# Patient Record
Sex: Female | Born: 1950
Health system: Southern US, Community
[De-identification: ages and names within clinical notes are randomized; demographics above are authoritative.]

## PROBLEM LIST (undated history)

## (undated) DIAGNOSIS — D219 Benign neoplasm of connective and other soft tissue, unspecified: Secondary | ICD-10-CM

## (undated) DIAGNOSIS — Z87442 Personal history of urinary calculi: Secondary | ICD-10-CM

## (undated) DIAGNOSIS — F419 Anxiety disorder, unspecified: Secondary | ICD-10-CM

## (undated) DIAGNOSIS — R7303 Prediabetes: Secondary | ICD-10-CM

## (undated) DIAGNOSIS — M1990 Unspecified osteoarthritis, unspecified site: Secondary | ICD-10-CM

## (undated) DIAGNOSIS — Z8719 Personal history of other diseases of the digestive system: Secondary | ICD-10-CM

## (undated) DIAGNOSIS — I10 Essential (primary) hypertension: Secondary | ICD-10-CM

## (undated) DIAGNOSIS — K219 Gastro-esophageal reflux disease without esophagitis: Secondary | ICD-10-CM

## (undated) DIAGNOSIS — N3281 Overactive bladder: Secondary | ICD-10-CM

## (undated) DIAGNOSIS — G709 Myoneural disorder, unspecified: Secondary | ICD-10-CM

## (undated) DIAGNOSIS — G4730 Sleep apnea, unspecified: Secondary | ICD-10-CM

## (undated) DIAGNOSIS — G510 Bell's palsy: Secondary | ICD-10-CM

## (undated) HISTORY — PX: LASIK: SHX215

## (undated) HISTORY — PX: APPENDECTOMY: SHX54

## (undated) HISTORY — PX: BREAST REDUCTION SURGERY: SHX8

## (undated) HISTORY — DX: Overactive bladder: N32.81

## (undated) HISTORY — DX: Sleep apnea, unspecified: G47.30

## (undated) HISTORY — DX: Anxiety disorder, unspecified: F41.9

## (undated) HISTORY — DX: Bell's palsy: G51.0

## (undated) HISTORY — PX: TUBAL LIGATION: SHX77

## (undated) HISTORY — PX: BREAST SURGERY: SHX581

## (undated) HISTORY — PX: ACHILLES TENDON SURGERY: SHX542

## (undated) HISTORY — DX: Benign neoplasm of connective and other soft tissue, unspecified: D21.9

---

## 1973-10-22 HISTORY — PX: CHOLECYSTECTOMY: SHX55

## 1986-10-22 HISTORY — PX: CARPAL TUNNEL RELEASE: SHX101

## 1993-10-22 HISTORY — PX: ABDOMINAL HYSTERECTOMY: SHX81

## 1998-04-19 ENCOUNTER — Other Ambulatory Visit: Admission: RE | Admit: 1998-04-19 | Discharge: 1998-04-19 | Payer: Self-pay | Admitting: Obstetrics and Gynecology

## 1998-05-09 ENCOUNTER — Ambulatory Visit (HOSPITAL_COMMUNITY): Admission: RE | Admit: 1998-05-09 | Discharge: 1998-05-09 | Payer: Self-pay | Admitting: Obstetrics and Gynecology

## 1999-05-11 ENCOUNTER — Ambulatory Visit (HOSPITAL_COMMUNITY): Admission: RE | Admit: 1999-05-11 | Discharge: 1999-05-11 | Payer: Self-pay | Admitting: Obstetrics and Gynecology

## 1999-05-17 ENCOUNTER — Encounter: Payer: Self-pay | Admitting: Obstetrics and Gynecology

## 1999-05-17 ENCOUNTER — Ambulatory Visit (HOSPITAL_COMMUNITY): Admission: RE | Admit: 1999-05-17 | Discharge: 1999-05-17 | Payer: Self-pay | Admitting: Obstetrics and Gynecology

## 1999-05-24 ENCOUNTER — Ambulatory Visit (HOSPITAL_COMMUNITY): Admission: RE | Admit: 1999-05-24 | Discharge: 1999-05-24 | Payer: Self-pay | Admitting: Obstetrics and Gynecology

## 1999-05-24 ENCOUNTER — Encounter: Payer: Self-pay | Admitting: Obstetrics and Gynecology

## 1999-05-24 ENCOUNTER — Encounter (INDEPENDENT_AMBULATORY_CARE_PROVIDER_SITE_OTHER): Payer: Self-pay | Admitting: Specialist

## 1999-06-21 ENCOUNTER — Other Ambulatory Visit: Admission: RE | Admit: 1999-06-21 | Discharge: 1999-06-21 | Payer: Self-pay | Admitting: Obstetrics and Gynecology

## 1999-10-24 ENCOUNTER — Ambulatory Visit (HOSPITAL_COMMUNITY): Admission: RE | Admit: 1999-10-24 | Discharge: 1999-10-24 | Payer: Self-pay | Admitting: Obstetrics and Gynecology

## 1999-10-24 ENCOUNTER — Encounter: Payer: Self-pay | Admitting: Obstetrics and Gynecology

## 2000-05-20 ENCOUNTER — Encounter: Payer: Self-pay | Admitting: Obstetrics and Gynecology

## 2000-05-20 ENCOUNTER — Ambulatory Visit (HOSPITAL_COMMUNITY): Admission: RE | Admit: 2000-05-20 | Discharge: 2000-05-20 | Payer: Self-pay | Admitting: Obstetrics and Gynecology

## 2000-06-20 ENCOUNTER — Other Ambulatory Visit: Admission: RE | Admit: 2000-06-20 | Discharge: 2000-06-20 | Payer: Self-pay | Admitting: Obstetrics and Gynecology

## 2001-05-21 ENCOUNTER — Encounter: Payer: Self-pay | Admitting: Obstetrics and Gynecology

## 2001-05-21 ENCOUNTER — Ambulatory Visit (HOSPITAL_COMMUNITY): Admission: RE | Admit: 2001-05-21 | Discharge: 2001-05-21 | Payer: Self-pay | Admitting: Obstetrics and Gynecology

## 2002-05-07 ENCOUNTER — Other Ambulatory Visit: Admission: RE | Admit: 2002-05-07 | Discharge: 2002-05-07 | Payer: Self-pay | Admitting: Obstetrics and Gynecology

## 2002-05-27 ENCOUNTER — Ambulatory Visit (HOSPITAL_COMMUNITY): Admission: RE | Admit: 2002-05-27 | Discharge: 2002-05-27 | Payer: Self-pay | Admitting: Obstetrics and Gynecology

## 2002-05-27 ENCOUNTER — Encounter: Payer: Self-pay | Admitting: Obstetrics and Gynecology

## 2003-05-12 ENCOUNTER — Emergency Department (HOSPITAL_COMMUNITY): Admission: EM | Admit: 2003-05-12 | Discharge: 2003-05-12 | Payer: Self-pay | Admitting: Emergency Medicine

## 2003-05-24 ENCOUNTER — Other Ambulatory Visit: Admission: RE | Admit: 2003-05-24 | Discharge: 2003-05-24 | Payer: Self-pay | Admitting: Obstetrics and Gynecology

## 2003-05-30 ENCOUNTER — Encounter: Payer: Self-pay | Admitting: Otolaryngology

## 2003-05-30 ENCOUNTER — Ambulatory Visit (HOSPITAL_COMMUNITY): Admission: RE | Admit: 2003-05-30 | Discharge: 2003-05-30 | Payer: Self-pay | Admitting: Otolaryngology

## 2003-05-31 ENCOUNTER — Encounter: Payer: Self-pay | Admitting: Obstetrics and Gynecology

## 2003-05-31 ENCOUNTER — Ambulatory Visit (HOSPITAL_COMMUNITY): Admission: RE | Admit: 2003-05-31 | Discharge: 2003-05-31 | Payer: Self-pay | Admitting: Obstetrics and Gynecology

## 2003-10-01 ENCOUNTER — Ambulatory Visit (HOSPITAL_BASED_OUTPATIENT_CLINIC_OR_DEPARTMENT_OTHER): Admission: RE | Admit: 2003-10-01 | Discharge: 2003-10-01 | Payer: Self-pay | Admitting: Otolaryngology

## 2003-10-01 ENCOUNTER — Ambulatory Visit (HOSPITAL_COMMUNITY): Admission: RE | Admit: 2003-10-01 | Discharge: 2003-10-01 | Payer: Self-pay | Admitting: Otolaryngology

## 2003-10-23 DIAGNOSIS — G510 Bell's palsy: Secondary | ICD-10-CM

## 2003-10-23 HISTORY — DX: Bell's palsy: G51.0

## 2004-05-24 ENCOUNTER — Other Ambulatory Visit: Admission: RE | Admit: 2004-05-24 | Discharge: 2004-05-24 | Payer: Self-pay | Admitting: Obstetrics and Gynecology

## 2004-06-14 ENCOUNTER — Ambulatory Visit (HOSPITAL_COMMUNITY): Admission: RE | Admit: 2004-06-14 | Discharge: 2004-06-14 | Payer: Self-pay | Admitting: Obstetrics and Gynecology

## 2004-08-28 ENCOUNTER — Ambulatory Visit: Payer: Self-pay | Admitting: Family Medicine

## 2004-10-22 HISTORY — PX: OTHER SURGICAL HISTORY: SHX169

## 2005-05-25 ENCOUNTER — Other Ambulatory Visit: Admission: RE | Admit: 2005-05-25 | Discharge: 2005-05-25 | Payer: Self-pay | Admitting: Obstetrics and Gynecology

## 2005-06-15 ENCOUNTER — Ambulatory Visit (HOSPITAL_COMMUNITY): Admission: RE | Admit: 2005-06-15 | Discharge: 2005-06-15 | Payer: Self-pay | Admitting: Obstetrics and Gynecology

## 2005-08-03 ENCOUNTER — Ambulatory Visit (HOSPITAL_COMMUNITY): Admission: RE | Admit: 2005-08-03 | Discharge: 2005-08-03 | Payer: Self-pay | Admitting: Otolaryngology

## 2005-08-03 ENCOUNTER — Ambulatory Visit (HOSPITAL_BASED_OUTPATIENT_CLINIC_OR_DEPARTMENT_OTHER): Admission: RE | Admit: 2005-08-03 | Discharge: 2005-08-03 | Payer: Self-pay | Admitting: Otolaryngology

## 2005-08-07 ENCOUNTER — Ambulatory Visit: Payer: Self-pay | Admitting: Family Medicine

## 2005-08-14 ENCOUNTER — Ambulatory Visit: Payer: Self-pay | Admitting: Family Medicine

## 2006-01-15 ENCOUNTER — Ambulatory Visit: Payer: Self-pay | Admitting: Family Medicine

## 2006-05-31 ENCOUNTER — Other Ambulatory Visit: Admission: RE | Admit: 2006-05-31 | Discharge: 2006-05-31 | Payer: Self-pay | Admitting: Obstetrics and Gynecology

## 2006-08-20 ENCOUNTER — Ambulatory Visit: Payer: Self-pay | Admitting: Family Medicine

## 2006-08-20 LAB — CONVERTED CEMR LAB
ALT: 52 units/L — ABNORMAL HIGH (ref 0–40)
AST: 37 units/L (ref 0–37)
Albumin: 3.6 g/dL (ref 3.5–5.2)
Alkaline Phosphatase: 125 units/L — ABNORMAL HIGH (ref 39–117)
BUN: 16 mg/dL (ref 6–23)
CO2: 32 meq/L (ref 19–32)
Chloride: 103 meq/L (ref 96–112)
Eosinophil percent: 2.7 % (ref 0.0–5.0)
Glucose, Bld: 94 mg/dL (ref 70–99)
HCT: 42.4 % (ref 36.0–46.0)
LDL Cholesterol: 91 mg/dL (ref 0–99)
Lymphocytes Relative: 23.1 % (ref 12.0–46.0)
MCHC: 34 g/dL (ref 30.0–36.0)
MCV: 91.1 fL (ref 78.0–100.0)
Monocytes Relative: 5.4 % (ref 3.0–11.0)
Platelets: 250 10*3/uL (ref 150–400)
Potassium: 4.1 meq/L (ref 3.5–5.1)
RBC: 4.65 M/uL (ref 3.87–5.11)
RDW: 12.5 % (ref 11.5–14.6)
Total Protein: 7.1 g/dL (ref 6.0–8.3)
VLDL: 35 mg/dL (ref 0–40)

## 2006-08-27 ENCOUNTER — Ambulatory Visit: Payer: Self-pay | Admitting: Family Medicine

## 2007-02-07 ENCOUNTER — Ambulatory Visit: Payer: Self-pay | Admitting: Internal Medicine

## 2007-06-25 ENCOUNTER — Other Ambulatory Visit: Admission: RE | Admit: 2007-06-25 | Discharge: 2007-06-25 | Payer: Self-pay | Admitting: Obstetrics and Gynecology

## 2007-07-28 ENCOUNTER — Encounter: Payer: Self-pay | Admitting: Family Medicine

## 2008-07-02 ENCOUNTER — Other Ambulatory Visit: Admission: RE | Admit: 2008-07-02 | Discharge: 2008-07-02 | Payer: Self-pay | Admitting: Obstetrics and Gynecology

## 2008-07-02 ENCOUNTER — Ambulatory Visit: Payer: Self-pay | Admitting: Obstetrics and Gynecology

## 2008-07-02 ENCOUNTER — Encounter: Payer: Self-pay | Admitting: Obstetrics and Gynecology

## 2008-07-28 ENCOUNTER — Encounter: Payer: Self-pay | Admitting: Family Medicine

## 2009-06-02 ENCOUNTER — Encounter: Payer: Self-pay | Admitting: Family Medicine

## 2009-06-02 ENCOUNTER — Ambulatory Visit: Payer: Self-pay | Admitting: Internal Medicine

## 2009-07-04 ENCOUNTER — Ambulatory Visit: Payer: Self-pay | Admitting: Obstetrics and Gynecology

## 2009-07-04 ENCOUNTER — Other Ambulatory Visit: Admission: RE | Admit: 2009-07-04 | Discharge: 2009-07-04 | Payer: Self-pay | Admitting: Obstetrics and Gynecology

## 2009-07-04 ENCOUNTER — Encounter: Payer: Self-pay | Admitting: Obstetrics and Gynecology

## 2009-07-29 ENCOUNTER — Ambulatory Visit: Payer: Self-pay | Admitting: Obstetrics and Gynecology

## 2009-07-29 ENCOUNTER — Encounter: Payer: Self-pay | Admitting: Family Medicine

## 2009-08-04 ENCOUNTER — Encounter: Payer: Self-pay | Admitting: *Deleted

## 2010-07-07 ENCOUNTER — Other Ambulatory Visit: Admission: RE | Admit: 2010-07-07 | Discharge: 2010-07-07 | Payer: Self-pay | Admitting: Obstetrics and Gynecology

## 2010-07-07 ENCOUNTER — Ambulatory Visit: Payer: Self-pay | Admitting: Obstetrics and Gynecology

## 2010-08-11 ENCOUNTER — Ambulatory Visit: Payer: Self-pay | Admitting: Obstetrics and Gynecology

## 2010-08-17 ENCOUNTER — Telehealth: Payer: Self-pay | Admitting: Family Medicine

## 2010-10-01 ENCOUNTER — Emergency Department (HOSPITAL_COMMUNITY)
Admission: EM | Admit: 2010-10-01 | Discharge: 2010-10-02 | Payer: Self-pay | Source: Home / Self Care | Admitting: Emergency Medicine

## 2010-10-22 HISTORY — PX: ACHILLES TENDON SURGERY: SHX542

## 2010-11-21 NOTE — Progress Notes (Signed)
Summary: Pt called and said her gynecologist wants copy last bone density  Phone Note Call from Patient Call back at Work Phone [phone]   Caller: Patient Summary of Call: Pt called and is sch for bone density test on 08/24/10. Pts gynecologist, Dr. Luster Landsberg, is req a copy of pts last bone density test. Pls fax this to fax# [phone] [facility], attn Dr. Eda Paschal.  Initial call taken by: Lucy Antigua,  August 17, 2010 9:20 AM  Follow-up for Phone Call        faxed Follow-up by: Kern Reap CMA Duncan Dull),  August 17, 2010 12:02 PM

## 2010-11-22 DIAGNOSIS — Z1211 Encounter for screening for malignant neoplasm of colon: Secondary | ICD-10-CM

## 2011-01-02 LAB — URINE MICROSCOPIC-ADD ON

## 2011-01-02 LAB — CBC
MCV: 92.7 fL (ref 78.0–100.0)
Platelets: 255 10*3/uL (ref 150–400)
WBC: 12 10*3/uL — ABNORMAL HIGH (ref 4.0–10.5)

## 2011-01-02 LAB — DIFFERENTIAL
Basophils Relative: 1 % (ref 0–1)
Eosinophils Relative: 1 % (ref 0–5)
Lymphs Abs: 1.6 10*3/uL (ref 0.7–4.0)
Monocytes Absolute: 0.7 10*3/uL (ref 0.1–1.0)
Monocytes Relative: 6 % (ref 3–12)
Neutrophils Relative %: 79 % — ABNORMAL HIGH (ref 43–77)

## 2011-01-02 LAB — BASIC METABOLIC PANEL
CO2: 21 mEq/L (ref 19–32)
Chloride: 101 mEq/L (ref 96–112)
GFR calc Af Amer: >60 mL/min (ref >60)
GFR calc non Af Amer: 55 mL/min — ABNORMAL LOW (ref >60)
Glucose, Bld: 120 mg/dL — ABNORMAL HIGH (ref 70–99)
Sodium: 133 mEq/L — ABNORMAL LOW (ref 135–145)

## 2011-01-02 LAB — URINE CULTURE: Culture  Setup Time: 201112120955

## 2011-01-02 LAB — URINALYSIS, ROUTINE W REFLEX MICROSCOPIC
Glucose, UA: NEGATIVE mg/dL
Ketones, ur: NEGATIVE mg/dL
Protein, ur: 30 mg/dL — AB

## 2011-03-09 NOTE — Op Note (Signed)
[name], Crystal Potter                         ACCOUNT NO.:  [id number]   MEDICAL RECORD NO.:  [id number]                   PATIENT TYPE:  AMB   LOCATION:  DSC                                  FACILITY:  [facility]   PHYSICIAN:  Lucky Cowboy, M.D.                    DATE OF BIRTH:  07-20-1951   DATE OF PROCEDURE:  10/01/2003  DATE OF DISCHARGE:                                 OPERATIVE REPORT   PREOPERATIVE DIAGNOSIS:  Right facial paralysis.   POSTOPERATIVE DIAGNOSIS:  Right facial paralysis.   PROCEDURE:  Right upper eyelid Gold weight implant.   SURGEON:  Lucky Cowboy, M.D.   ANESTHESIA:  General endotracheal anesthesia.   ESTIMATED BLOOD LOSS:  Less than 5 mL.   SPECIMENS:  None.   COMPLICATIONS:  None.   INDICATIONS FOR PROCEDURE:  This patient is a 60 year old female who  sustained a right facial nerve paralysis in late July 2004.  Since that  time, there has been some improvement in muscle tone; however, she is still  unable to completely close the right eyelid, and there has been some drying,  although no corneal infection or abrasion.  She has had some opening of  approximately 3.0 to 4.0 mm, with maximum effort, and for this reason, Gold  weight implantation is performed.   FINDINGS:  The patient was implanted with a 1.0 g Gold weight implant.   DESCRIPTION OF PROCEDURE:  The patient was taken to the operating room and  placed on the operating room table in the supine position.  Her head was  elevated in a bench chair-like position.  The right eye was prepped with  Betadine and draped in the usual sterile fashion.  The mid-pupillary line  was identified and marked using a marking pen.  Lidocaine 1% with 1:100,000  epinephrine was then used to infiltrate the area, once the incision line was  drawn out.  This was made in a transverse fashion using the marking pen  about 1.0 cm superior to the lash line.  Lidocaine 1% with 1:00,000 of  epinephrine was used in an amount of  0.6 mL.  A clear corneal shield was  placed, after Lacri-Lube had been placed over the eyeball.  After a  vasoconstrictive effect had taken place, a #15 blade was used to make a  transverse 2.0 cm incision in the upper eyelid, 1.0 cm superior to the lash  line.  Cautery was performed using electrocautery.  The orbicularis oculi  was divided using tenotomy scissors.  The pocket was then developed between  the tarsal plate and orbicularis oculi muscle down to approximately the lash  line.  At this point, the Gold weight was then placed with the two-holes  down and one-hole up.  Two-thirds of the Gold weight was placed medial  through the mid-pupillary line, and one-third placed lateral to the mid-  pupillary line.  The  Gold weight was secured to the tarsal plate in a simple  interrupted fashion using #6-0 Prolene.  It was secured in three locations  by taking a piece of orbicularis oculi, tarsal plate, and securing with a  Gold weight.  The orbicularis oculi muscle was reapproximated in a simple  interrupted buried fashion using #5-0 Vicryl.  The eyelid was then  reapproximated in a running fashion using #6-0 Prolene.  Benzoin followed by  a Steri-Strip was applied to the  upper eyelid.  The patient was awakened and taken to the post-anesthesia care unit in  stable condition.  There were no complications.                                               Lucky Cowboy, M.D.    SJ/MEDQ  D:  10/01/2003  T:  10/02/2003  Job:  [id number]   cc:   Tinnie Gens A. Tawanna Cooler, M.D. [facility]

## 2011-03-09 NOTE — Op Note (Signed)
Crystal Potter, Crystal Potter               ACCOUNT NO.:  [id number]   MEDICAL RECORD NO.:  [id number]          PATIENT TYPE:  AMB   LOCATION:  DSC                          FACILITY:  [facility]   PHYSICIAN:  Lucky Cowboy, MD         DATE OF BIRTH:  1951/03/07   DATE OF PROCEDURE:  08/03/2005  DATE OF DISCHARGE:                                 OPERATIVE REPORT   PREOPERATIVE DIAGNOSIS:  Bell's palsy with right lid ptosis.   POSTOPERATIVE DIAGNOSIS:  Bell's palsy with right lid ptosis.   PROCEDURE:  Right upper lid blepharoplasty, removal of right upper lid gold  weight and replacement with a smaller right upper lid platinum 0.6-gram-  weight upper lid implant.   SURGEON:  Lucky Cowboy, MD   ANESTHESIA:  General endotracheal anesthesia.   ESTIMATED BLOOD LOSS:  Less than 20 mL.   SPECIMENS:  Gold weight implant to the patient.   COMPLICATIONS:  None.   INDICATION:  This patient is a 60 year old female who developed right facial  nerve paralysis in late July 2004.  Over time, she has had some mild return  of function.  On October 01, 2003, she required placement of the right  upper lid 1.0-g gold weight implant due to failure to close the right upper  eyelid and drying tearing of the right eye.  She has since that time  undergone right brow lift by Dr. Rolanda Jay.  She has also regained some  mild function.  At this point, the weight is too heavy; however, she does  still require some weight to close the lid.  For these reasons, the weight  today is being replaced with a smaller 0.6-g weight which is platinum to  provide a lower profile.   PROCEDURE:  The patient was taken to the operating room and placed on the  table in the supine position.  The face was prepped with Betadine and draped  in the usual fashion.  The previous area of skin excision had been marked.  1% lidocaine with 1:100,000 of epinephrine was used to inject the  subcutaneous tissues over the upper eyelid.  A #15 blade was  then used to  make the skin excision.  Bovie cautery was used to excise the elliptical  portion of skin over the upper eyelid, leaving muscle in place.  At this  point, the area over the weight was identified and divided sharply with  scissors.  The gold weight was identified and sutures cut.  The sutures were  removed as was the gold weight.  This was returned to the patient at the end  of the case.  A 0.6-g platinum weight was then sutured to the tarsal plate  in a simple interrupted fashion using 6-0 Prolene.  The 2 holes were placed  closest to the lash line with 1 hole was being placed more superior.  The  orbicularis oculi was then reapproximated in a simple interrupted fashion  using 5-0 Vicryl.  The subcutaneous tissues were reapproximated in a simple  interrupted fashion using 5-0 Vicryl.  The skin was closed  in a running  stitch using 6-0 Prolene.  Bacitracin and Polymyxin ointment were  applied to the upper eyelid.  The table was rotated clockwise 90 degrees to  its original position and the patient awakened from anesthesia.  She was  taken to the postanesthesia care unit in stable condition.  There were no  complications.      Lucky Cowboy, MD  Electronically Signed     SJ/MEDQ  D:  08/03/2005  T:  08/04/2005  Job:  [id number]   cc:   Gin[facility]   Tinnie Gens A. Tawanna Cooler, M.D. [facility]  [address] [place]  [place] [address]

## 2011-03-14 ENCOUNTER — Encounter (HOSPITAL_BASED_OUTPATIENT_CLINIC_OR_DEPARTMENT_OTHER)
Admission: RE | Admit: 2011-03-14 | Discharge: 2011-03-14 | Disposition: A | Discharge status: Home / Self Care | Payer: BC Managed Care – PPO | Source: Ambulatory Visit | Attending: Orthopedic Surgery | Admitting: Orthopedic Surgery

## 2011-03-16 ENCOUNTER — Ambulatory Visit (HOSPITAL_BASED_OUTPATIENT_CLINIC_OR_DEPARTMENT_OTHER)
Admission: RE | Admit: 2011-03-16 | Discharge: 2011-03-17 | Disposition: A | Discharge status: Home / Self Care | Payer: BC Managed Care – PPO | Source: Ambulatory Visit | Attending: Orthopedic Surgery | Admitting: Orthopedic Surgery

## 2011-03-16 DIAGNOSIS — Z01812 Encounter for preprocedural laboratory examination: Secondary | ICD-10-CM | POA: Insufficient documentation

## 2011-03-16 DIAGNOSIS — M24176 Other articular cartilage disorders, unspecified foot: Secondary | ICD-10-CM | POA: Insufficient documentation

## 2011-03-16 DIAGNOSIS — M249 Joint derangement, unspecified: Secondary | ICD-10-CM | POA: Insufficient documentation

## 2011-03-16 DIAGNOSIS — M6240 Contracture of muscle, unspecified site: Secondary | ICD-10-CM | POA: Insufficient documentation

## 2011-03-16 DIAGNOSIS — M928 Other specified juvenile osteochondrosis: Secondary | ICD-10-CM | POA: Insufficient documentation

## 2011-03-16 DIAGNOSIS — Z01810 Encounter for preprocedural cardiovascular examination: Secondary | ICD-10-CM | POA: Insufficient documentation

## 2011-03-16 LAB — POCT HEMOGLOBIN-HEMACUE: Hemoglobin: 14.8 g/dL (ref 12.0–15.0)

## 2011-03-20 NOTE — Op Note (Signed)
Crystal Potter, Crystal Potter               ACCOUNT NO.:  [id number]  MEDICAL RECORD NO.:  [id number]           PATIENT TYPE:  LOCATION:                                 FACILITY:  PHYSICIAN:  Toni Arthurs, MD        DATE OF BIRTH:  09-Mar-1951  DATE OF PROCEDURE:  03/16/2011 DATE OF DISCHARGE:                              OPERATIVE REPORT   PREOPERATIVE DIAGNOSES: 1. Left gastrocnemius contracture. 2. Left Achilles insertional tendinopathy. 3. Left Haglund deformity.  POSTOPERATIVE DIAGNOSES: 1. Left gastrocnemius contracture. 2. Left Achilles insertional tendinopathy. 3. Left Haglund deformity.  PROCEDURE: 1. Left gastrocnemius recession (Strayer procedure). 2. Left Achilles tendon debridement. 3. Left partial calcanectomy (Haglund deformity excision). 4. Intraoperative interpretation of fluoroscopic images.  SURGEON:  Toni Arthurs, MD  ANESTHESIA:  General, regional.  IV FLUIDS:  See anesthesia record.  ESTIMATED BLOOD LOSS:  Minimal.  TOURNIQUET TIME:  One hour and 23 minutes at 300 mmHg.  COMPLICATIONS:  None apparent.  DISPOSITION:  Extubated, awake, and stable to recovery.  INDICATIONS FOR PROCEDURE:  The patient is a 60 year old female who has a long history of left heel pain.  She has a gastrocnemius contracture as well as a large Haglund deformity and insertional Achilles tendinopathy.  She presents now for operative treatment of this condition having failed treatment with activity modification, physical therapy, shoe wear modification, and anti-inflammatory medications.  She understands the risks and benefits of this procedure as well as the alternative treatment options.  Specifically, she understands the risks of bleeding, infection, nerve damage, blood clots, need for additional surgery, amputation, and death.  PROCEDURE IN DETAIL:  After preoperative consent was obtained and the correct operative site was identified, the patient was brought to the operating  room supine on a gurney.  General anesthesia was administered. Preoperative antibiotics were administered.  A surgical time-out was taken.  The left lower extremity was exsanguinated and a tourniquet was wrapped around the thigh and inflated.  The patient was then turned into the prone position on the operating table.  The left lower extremity was prepped and draped in standard sterile fashion.  A longitudinal incision was marked on the posterior aspect of the calf just distal from the insertion of the gastrocnemius.  A second incision was marked longitudinally on the skin over the bony prominence at the posterior aspect of the heel.  Proximal incision was made.  Sharp dissection was carried down through the skin.  Blunt dissection was carried down through the subcutaneous tissue taking care to protect the lesser saphenous vein and sural nerve.  The superficial fascia was incised. The gastrocnemius tendon was isolated.  It was divided under direct vision.  The plantaris tendon was also identified and transected.  The wound was irrigated copiously.  Inverted simple sutures of 3-0 Monocryl were used to close the subcutaneous tissue and a running 3-0 Prolene was used to close the skin.  Attention was then turned to the heel where the previously marked incision was made.  Sharp dissection was carried down through the skin and subcutaneous tissue to the peritenon which was also incised creating a  full-thickness flap.  The extended incision was carried down to the plantar surface of the heel pad posteriorly.  The tendon was split longitudinally, and the patient's Haglund deformity and posterior osteophytes were identified.  The tendon was released subperiosteally medially and laterally exposing the osteophytes.  A sagittal saw was then used to cut from the distal end of the osteophytes to the proximal end of the Haglund deformity.  The entire block of bone was removed.  The cut edges were  smoothed with a rasp.  The wound was irrigated copiously.  Two threaded anchors were then inserted into the cut surface of the calcaneus after drilling pilot holes.  The four limbs of suture from each anchor were passed through the tendon in horizontal mattress fashion.  The tendon was repaired down to the cut surface of the calcaneus using horizontal mattress sutures.  Two additional holes were drilled just distal to the Achilles insertion and two more anchors were inserted.  The limbs of suture from the proximal two anchors were then passed through the distal two anchors creating an hourglass configuration of suture, repairing the Achilles down to the cut surface of the calcaneus.  Sutures were trimmed.  The wound was irrigated copiously.  A longitudinal split in the tendon was repaired with 0 Vicryl simple sutures with the knots within the cut.  The peritenon was repaired with 0 Vicryl simple sutures.  A running 4-0 Prolene suture was used to close the skin.  Sterile dressings were applied followed by a well-padded short-leg splint with the ankle positioned in neutral.  The tourniquet was released at 1 hour and 23 minutes after application of the dressings.  The patient was then awakened by Anesthesia and transported to the recovery room in stable condition.  FOLLOWUP PLAN:  The patient will remain overnight for observation due to her undiagnosed sleep apnea.  She will follow up with me in 2 weeks for suture removal and conversion to a walking boot.     Toni Arthurs, MD     JH/MEDQ  D:  03/16/2011  T:  03/16/2011  Job:  [id number]  Electronically Signed by Toni Arthurs  on 03/20/2011 01:39:13 PM

## 2011-07-04 DIAGNOSIS — D219 Benign neoplasm of connective and other soft tissue, unspecified: Secondary | ICD-10-CM | POA: Insufficient documentation

## 2011-07-04 DIAGNOSIS — N3281 Overactive bladder: Secondary | ICD-10-CM | POA: Insufficient documentation

## 2011-07-04 DIAGNOSIS — G510 Bell's palsy: Secondary | ICD-10-CM | POA: Insufficient documentation

## 2011-07-18 ENCOUNTER — Ambulatory Visit (INDEPENDENT_AMBULATORY_CARE_PROVIDER_SITE_OTHER): Payer: BC Managed Care – PPO | Admitting: Obstetrics and Gynecology

## 2011-07-18 ENCOUNTER — Encounter: Payer: Self-pay | Admitting: Obstetrics and Gynecology

## 2011-07-18 ENCOUNTER — Other Ambulatory Visit (HOSPITAL_COMMUNITY)
Admission: RE | Admit: 2011-07-18 | Discharge: 2011-07-18 | Disposition: A | Discharge status: Home / Self Care | Payer: BC Managed Care – PPO | Source: Ambulatory Visit | Attending: Obstetrics and Gynecology | Admitting: Obstetrics and Gynecology

## 2011-07-18 VITALS — BP 156/94 | Ht 66.0 in | Wt 294.0 lb

## 2011-07-18 DIAGNOSIS — R82998 Other abnormal findings in urine: Secondary | ICD-10-CM

## 2011-07-18 DIAGNOSIS — N390 Urinary tract infection, site not specified: Secondary | ICD-10-CM

## 2011-07-18 DIAGNOSIS — Z01419 Encounter for gynecological examination (general) (routine) without abnormal findings: Secondary | ICD-10-CM

## 2011-07-18 DIAGNOSIS — K219 Gastro-esophageal reflux disease without esophagitis: Secondary | ICD-10-CM | POA: Insufficient documentation

## 2011-07-18 DIAGNOSIS — R351 Nocturia: Secondary | ICD-10-CM

## 2011-07-18 NOTE — Progress Notes (Signed)
Patient came to see me today for an annual GYN exam. She is currently on Cymbalta for anxiety. Her blood pressure was elevated today and she is unaware that it's ever been elevated before. She is also noticed blood in her stools has not had a colonoscopy for a long period of time. She's currently not taking medicine for her nocturia and seems to be doing well without it. She's also noticed that her menopausal symptoms are much better on the Cymbalta.  HEENT: Within normal limits. Neck: No masses. Supraclavicular lymph nodes: Not enlarged. Breasts: Examined in both sitting and lying position. Symmetrical without skin changes or masses. Abdomen: Soft no masses guarding or rebound. No hernias. Pelvic: External within normal limits. BUS within normal limits. Vaginal examination shows good estrogen effect, no cystocele enterocele or rectocele. Cervix and uterus absent. Adnexa within normal limits. Rectovaginal confirmatory. Extremities within normal limits.   Assessment: #1. Elevated blood pressure #2. Nocturia #3. Rectal bleeding #4. Menopausal symptoms  Plan: Continue yearly mammograms. Patient to monitor her blood pressure at home and give me the results. She has a PCP and will refer her when necessary. Toler the Cymbalta will probably continue to help her menopausal symptoms. Told her to call her GI doctor and schedule an appointment because of the rectal bleeding.

## 2011-07-18 NOTE — Patient Instructions (Signed)
Monitor blood pressure and let me know.

## 2011-07-18 NOTE — Progress Notes (Signed)
Addended by: Landis Martins R on: 07/18/2011 10:40 AM   Modules accepted: Orders

## 2011-07-23 ENCOUNTER — Other Ambulatory Visit: Payer: Self-pay | Admitting: *Deleted

## 2011-07-23 MED ORDER — NITROFURANTOIN MONOHYD MACRO 100 MG PO CAPS: 100.0000 mg | ORAL_CAPSULE | Freq: Two times a day (BID) | ORAL | Status: AC

## 2011-07-23 NOTE — Progress Notes (Signed)
Addended by: Valeda Malm L on: 07/23/2011 09:46 AM   Modules accepted: Orders

## 2011-08-07 ENCOUNTER — Other Ambulatory Visit: Payer: BC Managed Care – PPO | Admitting: *Deleted

## 2011-08-07 DIAGNOSIS — N390 Urinary tract infection, site not specified: Secondary | ICD-10-CM

## 2011-08-07 DIAGNOSIS — Z5189 Encounter for other specified aftercare: Secondary | ICD-10-CM

## 2011-10-10 ENCOUNTER — Telehealth: Payer: Self-pay | Admitting: Gastroenterology

## 2011-10-10 NOTE — Telephone Encounter (Signed)
Pt states her primary care md wants her to have a colon done. Pt states she is not having any problems at the present time. Pt instructed to call back at the end of Jan. To schedule the procedure with Dr. Arlyce Dice when he returns. Pt verbalized understanding.

## 2011-10-25 ENCOUNTER — Encounter: Payer: Self-pay | Admitting: Obstetrics and Gynecology

## 2011-11-26 ENCOUNTER — Encounter: Payer: Self-pay | Admitting: Gastroenterology

## 2011-11-26 ENCOUNTER — Ambulatory Visit (AMBULATORY_SURGERY_CENTER): Payer: BC Managed Care – PPO

## 2011-11-26 VITALS — Ht 66.0 in | Wt 266.8 lb

## 2011-11-26 DIAGNOSIS — Z1211 Encounter for screening for malignant neoplasm of colon: Secondary | ICD-10-CM

## 2011-11-26 MED ORDER — PEG-KCL-NACL-NASULF-NA ASC-C 100 G PO SOLR: 1.0000 | Freq: Once | ORAL | Status: DC

## 2011-12-07 ENCOUNTER — Encounter: Payer: Self-pay | Admitting: Gastroenterology

## 2011-12-07 ENCOUNTER — Ambulatory Visit (AMBULATORY_SURGERY_CENTER): Payer: BC Managed Care – PPO | Admitting: Gastroenterology

## 2011-12-07 VITALS — BP 120/75 | HR 76 | Temp 98.5°F | Resp 18 | Ht 66.0 in | Wt 266.0 lb

## 2011-12-07 DIAGNOSIS — Z1211 Encounter for screening for malignant neoplasm of colon: Secondary | ICD-10-CM

## 2011-12-07 MED ORDER — SODIUM CHLORIDE 0.9 % IV SOLN: 500.0000 mL | INTRAVENOUS | Status: DC

## 2011-12-07 NOTE — Patient Instructions (Signed)
FOLLOW THE DISCHARGE INSTRUCTIONS ON THE GREEN AND BLUE INSTRUCTION SHEETS.  CONTINUE YOUR MEDICATIONS.  NEXT COLONOSCOPY IN 10 YEARS , 2023.

## 2011-12-07 NOTE — Op Note (Signed)
[facility] [address] [place], [place]  [address]  COLONOSCOPY PROCEDURE REPORT  PATIENT:  Crystal Potter, Crystal Potter  MR#:  [id number] BIRTHDATE:  1951-04-22, 60 yrs. old  GENDER:  female ENDOSCOPIST:  Barbette Hair. Arlyce Dice, MD REF. BY:  Deatra James, M.D. Edyth Gunnels, M.D. PROCEDURE DATE:  12/07/2011 PROCEDURE:  Diagnostic Colonoscopy ASA CLASS:  Class II INDICATIONS:  Routine Risk Screening MEDICATIONS:   MAC sedation, administered by CRNA propofol 250mg IV  DESCRIPTION OF PROCEDURE:   After the risks benefits and alternatives of the procedure were thoroughly explained, informed consent was obtained.  Digital rectal exam was performed and revealed no abnormalities.   The LB CF-H180AL [id number] endoscope was introduced through the anus and advanced to the cecum, which was identified by both the appendix and ileocecal valve, without limitations.  The quality of the prep was excellent, using MoviPrep.  The instrument was then slowly withdrawn as the colon was fully examined. <<PROCEDUREIMAGES>>  FINDINGS:  A normal appearing cecum, ileocecal valve, and appendiceal orifice were identified. The ascending, hepatic flexure, transverse, splenic flexure, descending, sigmoid colon, and rectum appeared unremarkable (see image1, image2, image3, and image4).   Retroflexed views in the rectum revealed no abnormalities.    The time to cecum =  1) 2.25  minutes. The scope was then withdrawn in  1) 7.25  minutes from the cecum and the procedure completed. COMPLICATIONS:  None ENDOSCOPIC IMPRESSION: 1) Normal colon RECOMMENDATIONS: 1) Continue current colorectal screening recommendations for "routine risk" patients with a repeat colonoscopy in 10 years. REPEAT EXAM:  In 10 year(s) for Colonoscopy.  ______________________________ Barbette Hair. Arlyce Dice, MD  CC:  n. eSIGNED:   Barbette Hair. Kaplan at 12/07/2011 09:44 AM  Claria Dice, [phone]

## 2011-12-07 NOTE — Progress Notes (Signed)
Patient did not experience any of the following events: a burn prior to discharge; a fall within the facility; wrong site/side/patient/procedure/implant event; or a hospital transfer or hospital admission upon discharge from the facility. (G8907) Patient did not have preoperative order for IV antibiotic SSI prophylaxis. (G8918)  

## 2011-12-10 ENCOUNTER — Telehealth: Payer: Self-pay | Admitting: *Deleted

## 2011-12-10 NOTE — Telephone Encounter (Signed)
MESSAGE LEFT FOR PATIENT

## 2011-12-13 ENCOUNTER — Encounter: Payer: Self-pay | Admitting: Obstetrics and Gynecology

## 2012-02-19 ENCOUNTER — Encounter (HOSPITAL_COMMUNITY): Payer: Self-pay | Admitting: Pharmacy Technician

## 2012-02-20 NOTE — H&P (Signed)
  [facility] [address]   [address] [place], [place] [address] [phone] A Division of [facility]  Loreta Ave, M.D.     Robert A. Thurston Hole, M.D.     Lunette Stands, M.D. Eulas Post, M.D.    Buford Dresser, M.D. Estell Harpin, M.D. Ralene Cork, D.O.          Genene Churn. Barry Dienes, PA-C            Kirstin A. Shepperson, PA-C Janace Litten, OPA-C   RE: Davon, Folta                                [id number]      DOB: 03/03/1951 PROGRESS NOTE: 02-19-12 Chief complaint: Left knee pain.  History of present illness: 61 year-old white female with a history of end stage DJD, left knee.  Returns.  States that symptoms are unchanged from previous visit.  She is wanting to proceed with total knee replacement as scheduled.  Received pre-op clearance from Dr. Deatra Alexxander Kurt.   Current medications: Cymbalta, Lisinopril/HCTZ, fish oil, Calcium plus Vitamin D, Vitamin C, Women's One Daily tablet, Probiotic and Aleve.  Allergies: No known drug allergies. Past medical/surgical history: Hypertension, overactive bladder, GERD, Bell's palsy with right sided permanent paralysis, obesity, depression, cholecystectomy, hysterectomy, bladder tack, bilateral carpal tunnel release and Achilles tendon repair in May of 2012. Review of systems: Patient denies fevers, chills, lightheadedness, dizziness, cardiac, pulmonary, GI or GU issues.   Family history: Positive for hypertension, breast cancer, diabetes, coronary artery disease and depression.  Social history: Does not smoke or drink.  Patient is married and employed as a Surveyor, mining.        EXAMINATION: Height: 5?5.  Weight: 262 pounds.  Blood pressure: 129/73.  Pulse: 72.  Temperature: 98.2.  Pleasant white female, alert and oriented x 3 and in no acute distress.  Gait is antalgic.  Head is normocephalic, a traumatic.  PERRLA, EOMI.  Neck unremarkable.  Lungs: CTA bilaterally.  No  wheezes.  Heart: RRR.  S1 and S2.  No murmurs.  Abdomen: Round and non-distended.  NBS x 4.  Soft and non-tender.  Left knee: Decreased range of motion.  Positive crepitus.  Joint line tender.  2+ effusion.  Ligaments stable.  Calf non-tender.  Neurovascularly intact.  Skin warm and dry.    IMPRESSION: End stage DJD, left knee, and chronic pain.  Failed conservative treatment.   PLAN: We will proceed with left total knee replacement as scheduled.  Surgical procedure, along with potential rehab/recovery time discussed.  All questions answered.    Loreta Ave, M.D.   Electronically verified by Loreta Ave, M.D. DFM(JMO):jjh D 02-19-12 T 02-20-12

## 2012-02-21 ENCOUNTER — Encounter (HOSPITAL_COMMUNITY): Payer: Self-pay

## 2012-02-21 ENCOUNTER — Encounter (HOSPITAL_COMMUNITY)
Admission: RE | Admit: 2012-02-21 | Discharge: 2012-02-21 | Disposition: A | Discharge status: Home / Self Care | Payer: BC Managed Care – PPO | Source: Ambulatory Visit | Attending: Orthopedic Surgery | Admitting: Orthopedic Surgery

## 2012-02-21 ENCOUNTER — Encounter (HOSPITAL_COMMUNITY)
Admission: RE | Admit: 2012-02-21 | Discharge: 2012-02-21 | Disposition: A | Discharge status: Home / Self Care | Payer: BC Managed Care – PPO | Source: Ambulatory Visit | Attending: Surgery | Admitting: Surgery

## 2012-02-21 HISTORY — DX: Essential (primary) hypertension: I10

## 2012-02-21 HISTORY — DX: Myoneural disorder, unspecified: G70.9

## 2012-02-21 HISTORY — DX: Unspecified osteoarthritis, unspecified site: M19.90

## 2012-02-21 HISTORY — DX: Personal history of other diseases of the digestive system: Z87.19

## 2012-02-21 HISTORY — DX: Gastro-esophageal reflux disease without esophagitis: K21.9

## 2012-02-21 LAB — URINALYSIS, ROUTINE W REFLEX MICROSCOPIC
Bilirubin Urine: NEGATIVE
Glucose, UA: NEGATIVE mg/dL
Hgb urine dipstick: NEGATIVE
Ketones, ur: NEGATIVE mg/dL
Nitrite: NEGATIVE
Protein, ur: NEGATIVE mg/dL
Specific Gravity, Urine: 1.01 (ref 1.005–1.030)
Urobilinogen, UA: 0.2 mg/dL (ref 0.0–1.0)
pH: 6 (ref 5.0–8.0)

## 2012-02-21 LAB — COMPREHENSIVE METABOLIC PANEL
ALT: 23 U/L (ref 0–35)
AST: 19 U/L (ref 0–37)
Albumin: 3.8 g/dL (ref 3.5–5.2)
Alkaline Phosphatase: 133 U/L — ABNORMAL HIGH (ref 39–117)
BUN: 19 mg/dL (ref 6–23)
CO2: 30 mEq/L (ref 19–32)
Calcium: 9.8 mg/dL (ref 8.4–10.5)
Chloride: 100 mEq/L (ref 96–112)
Creatinine, Ser: 0.76 mg/dL (ref 0.50–1.10)
GFR calc Af Amer: >90 mL/min (ref >90)
GFR calc non Af Amer: 90 mL/min — ABNORMAL LOW (ref >90)
Glucose, Bld: 85 mg/dL (ref 70–99)
Potassium: 4 mEq/L (ref 3.5–5.1)
Sodium: 140 mEq/L (ref 135–145)
Total Bilirubin: 0.3 mg/dL (ref 0.3–1.2)
Total Protein: 7.9 g/dL (ref 6.0–8.3)

## 2012-02-21 LAB — TYPE AND SCREEN
ABO/RH(D): O POS
Antibody Screen: NEGATIVE

## 2012-02-21 LAB — CBC
HCT: 44 % (ref 36.0–46.0)
Hemoglobin: 14.5 g/dL (ref 12.0–15.0)
MCH: 30 pg (ref 26.0–34.0)
MCHC: 33 g/dL (ref 30.0–36.0)
MCV: 90.9 fL (ref 78.0–100.0)
Platelets: 284 10*3/uL (ref 150–400)
RBC: 4.84 MIL/uL (ref 3.87–5.11)
RDW: 12.9 % (ref 11.5–15.5)
WBC: 10 10*3/uL (ref 4.0–10.5)

## 2012-02-21 LAB — SURGICAL PCR SCREEN
MRSA, PCR: NEGATIVE
Staphylococcus aureus: POSITIVE — AB

## 2012-02-21 LAB — PROTIME-INR
INR: 0.91 (ref 0.00–1.49)
Prothrombin Time: 12.5 seconds (ref 11.6–15.2)

## 2012-02-21 LAB — URINE MICROSCOPIC-ADD ON

## 2012-02-21 LAB — APTT: aPTT: 31 seconds (ref 24–37)

## 2012-02-21 NOTE — Pre-Procedure Instructions (Signed)
20 KATONYA BLECHER  02/21/2012   Your procedure is scheduled on:  02/27/2012  Report to [facility] at 6:30 AM.  Call this number if you have problems the morning of surgery: [phone]   Remember:   Do not eat food:After Midnight. TUESDAY  May have clear liquids: up to 4 Hours before arrival.  Nothing after 2:30 a.m.   Clear liquids include soda, tea, black coffee, apple or grape juice, broth.  Take these medicines the morning of surgery with A SIP OF WATER: Cymbalta   Do not wear jewelry, make-up or nail polish.  Do not wear lotions, powders, or perfumes. You may wear deodorant.  Do not shave 48 hours prior to surgery.  Do not bring valuables to the hospital.  Contacts, dentures or bridgework may not be worn into surgery.  Leave suitcase in the car. After surgery it may be brought to your room.  For patients admitted to the hospital, checkout time is 11:00 AM the day of discharge.   Patients discharged the day of surgery will not be allowed to drive home.  Name and phone number of your driver: /w SPOUSE  Special Instructions: CHG Shower Use Special Wash: 1/2 bottle night before surgery and 1/2 bottle morning of surgery.   Please read over the following fact sheets that you were given: Pain Booklet, Coughing and Deep Breathing, Blood Transfusion Information, MRSA Information and Surgical Site Infection Prevention

## 2012-02-22 NOTE — Progress Notes (Addendum)
[id number]  Friday.......UA shows rare bacteria... Trace of leukocytes...Marland Kitchenotherwise normal....Marland KitchenMarland KitchenDA

## 2012-02-26 MED ORDER — CEFAZOLIN SODIUM-DEXTROSE 2-3 GM-% IV SOLR
2.0000 g | INTRAVENOUS | Status: AC
  Administered 2012-02-27: 2 g via INTRAVENOUS
  Filled 2012-02-26: qty 50

## 2012-02-27 ENCOUNTER — Encounter (HOSPITAL_COMMUNITY)
Admission: RE | Disposition: A | Discharge status: Home / Self Care | Payer: Self-pay | Source: Ambulatory Visit | Attending: Orthopedic Surgery

## 2012-02-27 ENCOUNTER — Encounter (HOSPITAL_COMMUNITY): Payer: Self-pay | Admitting: Critical Care Medicine

## 2012-02-27 ENCOUNTER — Ambulatory Visit (HOSPITAL_COMMUNITY): Payer: BC Managed Care – PPO | Admitting: Critical Care Medicine

## 2012-02-27 ENCOUNTER — Inpatient Hospital Stay (HOSPITAL_COMMUNITY)
Admission: RE | Admit: 2012-02-27 | Discharge: 2012-02-29 | DRG: 209 | Disposition: A | Discharge status: Home / Self Care | Payer: BC Managed Care – PPO | Source: Ambulatory Visit | Attending: Orthopedic Surgery | Admitting: Orthopedic Surgery

## 2012-02-27 ENCOUNTER — Inpatient Hospital Stay (HOSPITAL_COMMUNITY): Payer: BC Managed Care – PPO

## 2012-02-27 DIAGNOSIS — Z90710 Acquired absence of both cervix and uterus: Secondary | ICD-10-CM

## 2012-02-27 DIAGNOSIS — G510 Bell's palsy: Secondary | ICD-10-CM | POA: Diagnosis present

## 2012-02-27 DIAGNOSIS — IMO0002 Reserved for concepts with insufficient information to code with codable children: Principal | ICD-10-CM | POA: Diagnosis present

## 2012-02-27 DIAGNOSIS — E669 Obesity, unspecified: Secondary | ICD-10-CM | POA: Diagnosis present

## 2012-02-27 DIAGNOSIS — M1710 Unilateral primary osteoarthritis, unspecified knee: Principal | ICD-10-CM | POA: Diagnosis present

## 2012-02-27 DIAGNOSIS — F329 Major depressive disorder, single episode, unspecified: Secondary | ICD-10-CM | POA: Diagnosis present

## 2012-02-27 DIAGNOSIS — Z471 Aftercare following joint replacement surgery: Secondary | ICD-10-CM

## 2012-02-27 DIAGNOSIS — F3289 Other specified depressive episodes: Secondary | ICD-10-CM | POA: Diagnosis present

## 2012-02-27 DIAGNOSIS — G8929 Other chronic pain: Secondary | ICD-10-CM | POA: Diagnosis present

## 2012-02-27 DIAGNOSIS — I10 Essential (primary) hypertension: Secondary | ICD-10-CM | POA: Diagnosis present

## 2012-02-27 DIAGNOSIS — K219 Gastro-esophageal reflux disease without esophagitis: Secondary | ICD-10-CM | POA: Diagnosis present

## 2012-02-27 DIAGNOSIS — Z79899 Other long term (current) drug therapy: Secondary | ICD-10-CM

## 2012-02-27 HISTORY — PX: TOTAL KNEE ARTHROPLASTY: SHX125

## 2012-02-27 SURGERY — ARTHROPLASTY, KNEE, TOTAL
Anesthesia: General | Site: Knee | Laterality: Left | Wound class: Clean

## 2012-02-27 MED ORDER — FENTANYL CITRATE 0.05 MG/ML IJ SOLN
INTRAMUSCULAR | Status: DC | PRN
  Administered 2012-02-27: 75 ug via INTRAVENOUS
  Administered 2012-02-27 (×2): 50 ug via INTRAVENOUS
  Administered 2012-02-27: 75 ug via INTRAVENOUS

## 2012-02-27 MED ORDER — ONDANSETRON HCL 4 MG/2ML IJ SOLN: 4.0000 mg | Freq: Four times a day (QID) | INTRAMUSCULAR | Status: DC | PRN

## 2012-02-27 MED ORDER — DULOXETINE HCL 60 MG PO CPEP
60.0000 mg | ORAL_CAPSULE | Freq: Every day | ORAL | Status: DC
  Administered 2012-02-28 – 2012-02-29 (×2): 60 mg via ORAL
  Filled 2012-02-27 (×2): qty 1

## 2012-02-27 MED ORDER — COUMADIN BOOK
Freq: Once | Status: AC
  Administered 2012-02-27: 13:00:00
  Filled 2012-02-27: qty 1

## 2012-02-27 MED ORDER — MENTHOL 3 MG MT LOZG: 1.0000 | LOZENGE | OROMUCOSAL | Status: DC | PRN

## 2012-02-27 MED ORDER — ONDANSETRON HCL 4 MG PO TABS: 4.0000 mg | ORAL_TABLET | Freq: Four times a day (QID) | ORAL | Status: DC | PRN

## 2012-02-27 MED ORDER — POTASSIUM CHLORIDE IN NACL 20-0.9 MEQ/L-% IV SOLN
INTRAVENOUS | Status: DC
  Administered 2012-02-27 – 2012-02-28 (×2): via INTRAVENOUS
  Filled 2012-02-27 (×6): qty 1000

## 2012-02-27 MED ORDER — SENNOSIDES-DOCUSATE SODIUM 8.6-50 MG PO TABS: 1.0000 | ORAL_TABLET | Freq: Every evening | ORAL | Status: DC | PRN

## 2012-02-27 MED ORDER — DEXAMETHASONE SODIUM PHOSPHATE 4 MG/ML IJ SOLN
INTRAMUSCULAR | Status: DC | PRN
  Administered 2012-02-27: 4 mg via INTRAVENOUS

## 2012-02-27 MED ORDER — ONDANSETRON HCL 4 MG/2ML IJ SOLN: 4.0000 mg | Freq: Once | INTRAMUSCULAR | Status: DC | PRN

## 2012-02-27 MED ORDER — LACTATED RINGERS IV SOLN
INTRAVENOUS | Status: DC | PRN
  Administered 2012-02-27 (×2): via INTRAVENOUS

## 2012-02-27 MED ORDER — HYDROMORPHONE HCL PF 1 MG/ML IJ SOLN
0.2500 mg | INTRAMUSCULAR | Status: DC | PRN
  Administered 2012-02-27: 0.5 mg via INTRAVENOUS

## 2012-02-27 MED ORDER — WARFARIN - PHARMACIST DOSING INPATIENT: Freq: Every day | Status: DC

## 2012-02-27 MED ORDER — SODIUM CHLORIDE 0.9 % IR SOLN
Status: DC | PRN
  Administered 2012-02-27: 3000 mL
  Administered 2012-02-27: 1000 mL

## 2012-02-27 MED ORDER — WARFARIN VIDEO: Freq: Once | Status: DC

## 2012-02-27 MED ORDER — FLEET ENEMA 7-19 GM/118ML RE ENEM: 1.0000 | ENEMA | Freq: Once | RECTAL | Status: AC | PRN

## 2012-02-27 MED ORDER — WARFARIN SODIUM 7.5 MG PO TABS
7.5000 mg | ORAL_TABLET | Freq: Once | ORAL | Status: AC
  Administered 2012-02-27: 7.5 mg via ORAL
  Filled 2012-02-27 (×2): qty 1

## 2012-02-27 MED ORDER — GLYCOPYRROLATE 0.2 MG/ML IJ SOLN
INTRAMUSCULAR | Status: DC | PRN
  Administered 2012-02-27: .6 mg via INTRAVENOUS

## 2012-02-27 MED ORDER — ACETAMINOPHEN 325 MG PO TABS: 650.0000 mg | ORAL_TABLET | Freq: Four times a day (QID) | ORAL | Status: DC | PRN

## 2012-02-27 MED ORDER — ONDANSETRON HCL 4 MG/2ML IJ SOLN
INTRAMUSCULAR | Status: DC | PRN
  Administered 2012-02-27: 4 mg via INTRAVENOUS

## 2012-02-27 MED ORDER — LIDOCAINE HCL (CARDIAC) 20 MG/ML IV SOLN
INTRAVENOUS | Status: DC | PRN
  Administered 2012-02-27: 80 mg via INTRAVENOUS

## 2012-02-27 MED ORDER — LIDOCAINE HCL 4 % MT SOLN
OROMUCOSAL | Status: DC | PRN
  Administered 2012-02-27: 4 mL via TOPICAL

## 2012-02-27 MED ORDER — PHENOL 1.4 % MT LIQD: 1.0000 | OROMUCOSAL | Status: DC | PRN

## 2012-02-27 MED ORDER — METHOCARBAMOL 500 MG PO TABS
500.0000 mg | ORAL_TABLET | Freq: Four times a day (QID) | ORAL | Status: DC | PRN
  Administered 2012-02-27 – 2012-02-29 (×3): 500 mg via ORAL
  Filled 2012-02-27 (×3): qty 1

## 2012-02-27 MED ORDER — OXYCODONE-ACETAMINOPHEN 5-325 MG PO TABS
1.0000 | ORAL_TABLET | ORAL | Status: DC | PRN
  Administered 2012-02-27: 2 via ORAL
  Administered 2012-02-28 – 2012-02-29 (×5): 1 via ORAL
  Administered 2012-02-29: 2 via ORAL
  Filled 2012-02-27 (×4): qty 1
  Filled 2012-02-27: qty 2
  Filled 2012-02-27: qty 1

## 2012-02-27 MED ORDER — CEFAZOLIN SODIUM 1-5 GM-% IV SOLN
1.0000 g | Freq: Three times a day (TID) | INTRAVENOUS | Status: AC
  Administered 2012-02-27 – 2012-02-28 (×3): 1 g via INTRAVENOUS
  Filled 2012-02-27 (×3): qty 50

## 2012-02-27 MED ORDER — PHENYLEPHRINE HCL 10 MG/ML IJ SOLN
INTRAMUSCULAR | Status: DC | PRN
  Administered 2012-02-27: 80 ug via INTRAVENOUS
  Administered 2012-02-27: 40 ug via INTRAVENOUS

## 2012-02-27 MED ORDER — BUPIVACAINE HCL (PF) 0.25 % IJ SOLN
INTRAMUSCULAR | Status: DC | PRN
  Administered 2012-02-27: 30 mL

## 2012-02-27 MED ORDER — PANTOPRAZOLE SODIUM 40 MG PO TBEC
40.0000 mg | DELAYED_RELEASE_TABLET | Freq: Every day | ORAL | Status: DC
  Administered 2012-02-27 – 2012-02-29 (×3): 40 mg via ORAL
  Filled 2012-02-27 (×3): qty 1

## 2012-02-27 MED ORDER — METHOCARBAMOL 100 MG/ML IJ SOLN
500.0000 mg | Freq: Four times a day (QID) | INTRAMUSCULAR | Status: DC | PRN
  Filled 2012-02-27: qty 5

## 2012-02-27 MED ORDER — ENOXAPARIN SODIUM 30 MG/0.3ML ~~LOC~~ SOLN
30.0000 mg | Freq: Two times a day (BID) | SUBCUTANEOUS | Status: DC
  Administered 2012-02-28 – 2012-02-29 (×3): 30 mg via SUBCUTANEOUS
  Filled 2012-02-27 (×5): qty 0.3

## 2012-02-27 MED ORDER — DIPHENHYDRAMINE HCL 50 MG/ML IJ SOLN: 12.5000 mg | Freq: Four times a day (QID) | INTRAMUSCULAR | Status: DC | PRN

## 2012-02-27 MED ORDER — DOCUSATE SODIUM 100 MG PO CAPS
100.0000 mg | ORAL_CAPSULE | Freq: Two times a day (BID) | ORAL | Status: DC
  Administered 2012-02-27 – 2012-02-29 (×4): 100 mg via ORAL
  Filled 2012-02-27 (×6): qty 1

## 2012-02-27 MED ORDER — MORPHINE SULFATE (PF) 1 MG/ML IV SOLN
INTRAVENOUS | Status: DC
  Administered 2012-02-27 – 2012-02-28 (×2): via INTRAVENOUS
  Administered 2012-02-28: 6 mg via INTRAVENOUS
  Administered 2012-02-28: 8 mg via INTRAVENOUS
  Filled 2012-02-27 (×2): qty 25

## 2012-02-27 MED ORDER — ACETAMINOPHEN 650 MG RE SUPP: 650.0000 mg | Freq: Four times a day (QID) | RECTAL | Status: DC | PRN

## 2012-02-27 MED ORDER — NEOSTIGMINE METHYLSULFATE 1 MG/ML IJ SOLN
INTRAMUSCULAR | Status: DC | PRN
  Administered 2012-02-27: 4 mg via INTRAVENOUS

## 2012-02-27 MED ORDER — MORPHINE SULFATE (PF) 1 MG/ML IV SOLN
INTRAVENOUS | Status: AC
  Filled 2012-02-27: qty 25

## 2012-02-27 MED ORDER — PROPOFOL 10 MG/ML IV EMUL
INTRAVENOUS | Status: DC | PRN
  Administered 2012-02-27: 200 mg via INTRAVENOUS
  Administered 2012-02-27: 50 mg via INTRAVENOUS

## 2012-02-27 MED ORDER — SODIUM CHLORIDE 0.9 % IJ SOLN: 9.0000 mL | INTRAMUSCULAR | Status: DC | PRN

## 2012-02-27 MED ORDER — DIPHENHYDRAMINE HCL 12.5 MG/5ML PO ELIX: 12.5000 mg | ORAL_SOLUTION | Freq: Four times a day (QID) | ORAL | Status: DC | PRN

## 2012-02-27 MED ORDER — ROCURONIUM BROMIDE 100 MG/10ML IV SOLN
INTRAVENOUS | Status: DC | PRN
  Administered 2012-02-27: 50 mg via INTRAVENOUS

## 2012-02-27 MED ORDER — PNEUMOCOCCAL VAC POLYVALENT 25 MCG/0.5ML IJ INJ
0.5000 mL | INJECTION | INTRAMUSCULAR | Status: AC
  Administered 2012-02-29: 0.5 mL via INTRAMUSCULAR
  Filled 2012-02-27: qty 0.5

## 2012-02-27 MED ORDER — NALOXONE HCL 0.4 MG/ML IJ SOLN: 0.4000 mg | INTRAMUSCULAR | Status: DC | PRN

## 2012-02-27 MED ORDER — ARTIFICIAL TEARS OP OINT
TOPICAL_OINTMENT | OPHTHALMIC | Status: DC | PRN
  Administered 2012-02-27: 1 via OPHTHALMIC

## 2012-02-27 MED ORDER — MORPHINE SULFATE 4 MG/ML IJ SOLN
INTRAMUSCULAR | Status: DC | PRN
  Administered 2012-02-27: 4 mg via INTRAVENOUS

## 2012-02-27 SURGICAL SUPPLY — 57 items
BANDAGE ESMARK 6X9 LF (GAUZE/BANDAGES/DRESSINGS) ×1 IMPLANT
BLADE SAG 18X100X1.27 (BLADE) ×4 IMPLANT
BNDG CMPR 9X6 STRL LF SNTH (GAUZE/BANDAGES/DRESSINGS) ×1
BNDG ESMARK 6X9 LF (GAUZE/BANDAGES/DRESSINGS) ×2
BOOTCOVER CLEANROOM LRG (PROTECTIVE WEAR) ×4 IMPLANT
BOWL SMART MIX CTS (DISPOSABLE) ×2 IMPLANT
CEMENT BONE SIMPLEX SPEEDSET (Cement) ×4 IMPLANT
CLOTH BEACON ORANGE TIMEOUT ST (SAFETY) ×2 IMPLANT
COVER BACK TABLE 24X17X13 BIG (DRAPES) ×1 IMPLANT
COVER SURGICAL LIGHT HANDLE (MISCELLANEOUS) ×2 IMPLANT
CUFF TOURNIQUET SINGLE 34IN LL (TOURNIQUET CUFF) ×2 IMPLANT
DRAPE EXTREMITY T 121X128X90 (DRAPE) ×2 IMPLANT
DRAPE PROXIMA HALF (DRAPES) ×2 IMPLANT
DRAPE U-SHAPE 47X51 STRL (DRAPES) ×2 IMPLANT
DRSG PAD ABDOMINAL 8X10 ST (GAUZE/BANDAGES/DRESSINGS) ×2 IMPLANT
DURAPREP 26ML APPLICATOR (WOUND CARE) ×2 IMPLANT
ELECT CAUTERY BLADE 6.4 (BLADE) ×2 IMPLANT
ELECT REM PT RETURN 9FT ADLT (ELECTROSURGICAL) ×2
ELECTRODE REM PT RTRN 9FT ADLT (ELECTROSURGICAL) ×1 IMPLANT
EVACUATOR 1/8 PVC DRAIN (DRAIN) ×2 IMPLANT
FACESHIELD LNG OPTICON STERILE (SAFETY) ×2 IMPLANT
GAUZE XEROFORM 5X9 LF (GAUZE/BANDAGES/DRESSINGS) ×2 IMPLANT
GLOVE BIOGEL PI IND STRL 8 (GLOVE) ×1 IMPLANT
GLOVE BIOGEL PI INDICATOR 8 (GLOVE) ×1
GLOVE ORTHO TXT STRL SZ7.5 (GLOVE) ×2 IMPLANT
GOWN PREVENTION PLUS XLARGE (GOWN DISPOSABLE) ×4 IMPLANT
GOWN STRL NON-REIN LRG LVL3 (GOWN DISPOSABLE) ×4 IMPLANT
GOWN STRL REIN 2XL XLG LVL4 (GOWN DISPOSABLE) ×2 IMPLANT
HANDPIECE INTERPULSE COAX TIP (DISPOSABLE) ×2
IMMOBILIZER KNEE 22 UNIV (SOFTGOODS) ×2 IMPLANT
IMMOBILIZER KNEE 24 THIGH 36 (MISCELLANEOUS) IMPLANT
IMMOBILIZER KNEE 24 UNIV (MISCELLANEOUS)
KIT BASIN OR (CUSTOM PROCEDURE TRAY) ×2 IMPLANT
KIT ROOM TURNOVER OR (KITS) ×2 IMPLANT
MANIFOLD NEPTUNE II (INSTRUMENTS) ×2 IMPLANT
NS IRRIG 1000ML POUR BTL (IV SOLUTION) ×2 IMPLANT
PACK TOTAL JOINT (CUSTOM PROCEDURE TRAY) ×2 IMPLANT
PAD ARMBOARD 7.5X6 YLW CONV (MISCELLANEOUS) ×4 IMPLANT
PAD CAST 4YDX4 CTTN HI CHSV (CAST SUPPLIES) ×1 IMPLANT
PADDING CAST COTTON 4X4 STRL (CAST SUPPLIES) ×2
PADDING CAST COTTON 6X4 STRL (CAST SUPPLIES) ×2 IMPLANT
RUBBERBAND STERILE (MISCELLANEOUS) ×2 IMPLANT
SET HNDPC FAN SPRY TIP SCT (DISPOSABLE) ×1 IMPLANT
SPONGE GAUZE 4X4 12PLY (GAUZE/BANDAGES/DRESSINGS) ×2 IMPLANT
STAPLER VISISTAT 35W (STAPLE) ×2 IMPLANT
SUCTION FRAZIER TIP 10 FR DISP (SUCTIONS) ×1 IMPLANT
SUT VIC AB 1 CTX 36 (SUTURE) ×4
SUT VIC AB 1 CTX36XBRD ANBCTR (SUTURE) ×2 IMPLANT
SUT VIC AB 2-0 CT1 27 (SUTURE) ×4
SUT VIC AB 2-0 CT1 TAPERPNT 27 (SUTURE) ×2 IMPLANT
SYR 30ML LL (SYRINGE) ×2 IMPLANT
SYR 30ML SLIP (SYRINGE) ×2 IMPLANT
TOWEL OR 17X24 6PK STRL BLUE (TOWEL DISPOSABLE) ×2 IMPLANT
TOWEL OR 17X26 10 PK STRL BLUE (TOWEL DISPOSABLE) ×2 IMPLANT
TRAY FOLEY CATH 14FR (SET/KITS/TRAYS/PACK) ×2 IMPLANT
WATER STERILE IRR 1000ML POUR (IV SOLUTION) ×6 IMPLANT
YANKAUER SUCT BULB TIP NO VENT (SUCTIONS) ×1 IMPLANT

## 2012-02-27 NOTE — Transfer of Care (Signed)
Immediate Anesthesia Transfer of Care Note  Patient: Crystal Potter  Procedure(s) Performed: Procedure(s) (LRB): TOTAL KNEE ARTHROPLASTY (Left)  Patient Location: PACU  Anesthesia Type: GA combined with regional for post-op pain  Level of Consciousness: awake, alert  and oriented  Airway & Oxygen Therapy: Patient Spontanous Breathing and Patient connected to nasal cannula oxygen  Post-op Assessment: Report given to PACU RN, Post -op Vital signs reviewed and stable and Patient moving all extremities X 4  Post vital signs: Reviewed and stable  Complications: No apparent anesthesia complications

## 2012-02-27 NOTE — Anesthesia Postprocedure Evaluation (Signed)
  Anesthesia Post-op Note  Patient: Crystal Potter  Procedure(s) Performed: Procedure(s) (LRB): TOTAL KNEE ARTHROPLASTY (Left)  Patient Location: PACU  Anesthesia Type: General  Level of Consciousness: awake, alert , oriented and patient cooperative  Airway and Oxygen Therapy: Patient Spontanous Breathing and Patient connected to nasal cannula oxygen  Post-op Pain: mild  Post-op Assessment: Post-op Vital signs reviewed, Patient's Cardiovascular Status Stable, Respiratory Function Stable, Patent Airway, No signs of Nausea or vomiting and Pain level controlled  Post-op Vital Signs: stable  Complications: No apparent anesthesia complications

## 2012-02-27 NOTE — Preoperative (Signed)
Beta Blockers   Reason not to administer Beta Blockers:Not Applicable 

## 2012-02-27 NOTE — Interval H&P Note (Signed)
History and Physical Interval Note:  02/27/2012 8:17 AM  Crystal Potter  has presented today for surgery, with the diagnosis of DJD LEFT KNEE  The various methods of treatment have been discussed with the patient and family. After consideration of risks, benefits and other options for treatment, the patient has consented to  Procedure(s) (LRB): TOTAL KNEE ARTHROPLASTY (Left) as a surgical intervention .  The patients' history has been reviewed, patient examined, no change in status, stable for surgery.  I have reviewed the patients' chart and labs.  Questions were answered to the patient's satisfaction.     Valincia Touch F

## 2012-02-27 NOTE — Anesthesia Preprocedure Evaluation (Addendum)
Anesthesia Evaluation  Patient identified by MRN, date of birth, ID band Patient awake    Reviewed: Allergy & Precautions, H&P , NPO status , Patient's Chart, lab work & pertinent test results  Airway Mallampati: I TM Distance: >3 FB Neck ROM: Full    Dental  (+) Dental Advisory Given and Upper Dentures   Pulmonary          Cardiovascular hypertension, Pt. on medications Rhythm:Regular Rate:Normal     Neuro/Psych PSYCHIATRIC DISORDERS Anxiety  Neuromuscular disease    GI/Hepatic hiatal hernia, GERD-  Medicated,  Endo/Other    Renal/GU      Musculoskeletal   Abdominal   Peds  Hematology   Anesthesia Other Findings   Reproductive/Obstetrics                          Anesthesia Physical Anesthesia Plan  ASA: II  Anesthesia Plan: General and Regional   Post-op Pain Management:    Induction: Intravenous  Airway Management Planned: Oral ETT  Additional Equipment:   Intra-op Plan:   Post-operative Plan: Extubation in OR  Informed Consent: I have reviewed the patients History and Physical, chart, labs and discussed the procedure including the risks, benefits and alternatives for the proposed anesthesia with the patient or authorized representative who has indicated his/her understanding and acceptance.   Dental advisory given  Plan Discussed with: CRNA, Anesthesiologist and Surgeon  Anesthesia Plan Comments:       Anesthesia Quick Evaluation

## 2012-02-27 NOTE — Progress Notes (Signed)
ANTICOAGULATION CONSULT NOTE - Initial Consult  Pharmacy Consult for Coumadin Indication: VTE prophylaxis  No Known Allergies  Patient Measurements: Height: 5\' 6"  (167.6 cm) (on 02/21/12) Weight: 262 lb 2 oz (118.899 kg) (on 02/21/12) IBW/kg (Calculated) : 59.3    Vital Signs: Temp: 97.3 F (36.3 C) (05/08 1130) Temp src: Oral (05/08 0648) BP: 151/75 mmHg (05/08 1122) Pulse Rate: 95  (05/08 1133)  Labs:Preadmission labs from 02/21/12 PT 12.5,  INR 0.91, PTT 31,  H/H 14.5/44,  PLTC 284K  No results found for this basename: HGB:2,HCT:3,PLT:3,APTT:3,LABPROT:3,INR:3,HEPARINUNFRC:3,CREATININE:3,CKTOTAL:3,CKMB:3,TROPONINI:3 in the last 72 hours  Estimated Creatinine Clearance: 96.9 ml/min (by C-G formula based on Cr of 0.76). on 02/21/12   Medical History: Past Medical History  Diagnosis Date  . Bell's palsy 2005  . DI (detrusor instability)     urgency- fr time to time   . Fibroid   . GERD (gastroesophageal reflux disease)   . H/O hiatal hernia   . Neuromuscular disorder     Bells Palsy - R side of face - 2005  . Arthritis     knees, back, hand -R   . Anxiety     h/o panic attack- prior surgery for achilles   . Hypertension     followed by PCP, Dr. Wynelle Link    Medications:  Prescriptions prior to admission  Medication Sig Dispense Refill  . Ascorbic Acid (VITAMIN C PO) Take 1 tablet by mouth daily.      . Calcium Carbonate-Vitamin D (CALCIUM + D PO) Take 1 tablet by mouth daily.       . cholecalciferol (VITAMIN D) 1000 UNITS tablet Take 1,000 Units by mouth daily.        . DULoxetine (CYMBALTA) 60 MG capsule Take 60 mg by mouth every morning.       Marland Kitchen GLUCOSAMINE HCL-MSM PO Take 1 tablet by mouth 2 (two) times daily.      Marland Kitchen lisinopril-hydrochlorothiazide (PRINZIDE,ZESTORETIC) 20-12.5 MG per tablet Take 1 tablet by mouth 2 (two) times daily.       . Multiple Vitamin (MULTIVITAMIN) capsule Take 1 capsule by mouth daily.        . naproxen sodium (ANAPROX) 220 MG tablet Take 440 mg  by mouth 2 (two) times daily with a meal.      . Omega-3 Fatty Acids (FISH OIL PO) Take 1 capsule by mouth daily.       . Omeprazole (PRILOSEC PO) Take by mouth.        . Probiotic Product (PROBIOTIC PO) Take 2 capsules by mouth daily.        Scheduled:    .  ceFAZolin (ANCEF) IV  1 g Intravenous Q8H  .  ceFAZolin (ANCEF) IV  2 g Intravenous 60 min Pre-Op  . docusate sodium  100 mg Oral BID  . DULoxetine  60 mg Oral Daily  . enoxaparin  30 mg Subcutaneous Q12H  . pantoprazole  40 mg Oral Q1200    Assessment: 61 y.o. Female s/p L. TKA today for left knee DJD. Starting on Coumadin for VTE prophylaxis. Baseline INR 0.91, CBC within normal range. No h/o bleeding complications noted. No current s/sx of bleeding reported.    Goal of Therapy:  INR 2-3    Plan:  Coumadin 7.5 mg today x1 Monitor INR qAM Coumadin education book/video ordered.   Arman Filter, RPh 02/27/2012,12:46 PM

## 2012-02-27 NOTE — Progress Notes (Signed)
Orthopedic Tech Progress Note Patient Details:  Crystal Potter [birth year] [id number]  CPM Left Knee CPM Left Knee: On Left Knee Flexion (Degrees): 60  Left Knee Extension (Degrees): 0    [name] T 02/27/2012, 10:28 AM

## 2012-02-27 NOTE — Op Note (Signed)
NAMESOUL, DEVENEY NO.:  [id number]  MEDICAL RECORD NO.:  [id number]  LOCATION:  5041                         FACILITY:  [facility]  PHYSICIAN:  Loreta Ave, M.D. DATE OF BIRTH:  09/19/1951  DATE OF PROCEDURE:  02/27/2012 DATE OF DISCHARGE:                              OPERATIVE REPORT   PREOPERATIVE DIAGNOSIS:  Left knee end-stage degenerative arthritis, varus alignment.  POSTOPERATIVE DIAGNOSIS:  Left knee end-stage degenerative arthritis, varus alignment.  PROCEDURE:  Left knee modified minimally invasive total knee replacement with Stryker triathlon prosthesis.  Soft tissue balancing.  Cemented pegged posterior stabilized #4 femoral component.  Cemented #4 tibial component, 9 mm polyethylene insert.  Cemented resurfacing 35 mm patellar component.  SURGEON:  Loreta Ave, M.D.  ASSISTANT:  Genene Churn. Denton Meek., present throughout the entire case necessary for timely completion of procedure.  ANESTHESIA:  General.  BLOOD LOSS:  Minimal.  SPECIMENS:  None.  CULTURES:  None.  COMPLICATION:  None.  DRESSINGS:  Soft compressive knee immobilizer.  DRAINS:  Hemovac x1.  TOURNIQUET TIME:  45 minutes.  PROCEDURE:  The patient was brought to the operating room, placed on the operating table in supine position.  After adequate anesthesia had been obtained, knee examined.  Very mild flexion contracture.  5 degrees of varus partially correctable.  Tourniquet applied.  Prepped and draped in usual sterile fashion.  Exsanguinated with elevation, Esmarch. Tourniquet inflated to 350 mmHg.  Longitudinal incision above the patella down to the tibial tubercle.  Medial arthrotomy, vastus splitting, preserving quad tendon.  Knee exposed.  Grade 4 change throughout.  Remnants of menisci, loose bodies, periarticular spurs, cruciate ligaments excised.  Intramedullary guide on the femur.  5 degrees of valgus.  8 mm resection.  Using epicondylar axis, the  femur was sized, drilled, and fitted for a pegged #4 posterior stabilized component.  Proximal tibial resection.  Extramedullary guide.  3 degree posterior slope cut.  Also size #4 component.  Debris cleared throughout the knee in flexion and extension.  Nicely balanced in flexion and extension.  Patella exposed, posterior 10 mm removed, drilled, sized, and fitted for a 35 mm component.  Trials put in place throughout.  With the #4 above below the 9 insert and 35 patella, I had a nicely balanced knee with good biomechanical axis, nicely balanced in flexion and extension, good patellofemoral tracking.  Tibia was marked for rotation and hand reamed.  All trials had been removed.  Copious irrigation with a pulse irrigating device.  Cement prepared, placed on all components, firmly seated.  Polyethylene attached to tibia and knee reduced. Patella was clamped.  Once the cement hardened, I reexamined again placed with alignment, stability, motion.  Hemovac was placed and brought out through a separate stab wound.  Arthrotomy closed #1 Vicryl. Skin and subcutaneous tissue with Vicryl and staples.  Sterile compressive dressing applied.  Tourniquet deflated removed.  Knee immobilizer applied.  Anesthesia reversed.  Brought to recovery room. Tolerated surgery well.  No complications.     Loreta Ave, M.D.     DFM/MEDQ  D:  02/27/2012  T:  02/27/2012  Job:  [id number]

## 2012-02-27 NOTE — Anesthesia Procedure Notes (Addendum)
Anesthesia Regional Block:  Femoral nerve block  Pre-Anesthetic Checklist: ,, timeout performed, Correct Patient, Correct Site, Correct Laterality, Correct Procedure, Correct Position, site marked, Risks and benefits discussed,  Surgical consent,  Pre-op evaluation,  At surgeon's request and post-op pain management  Laterality: Left  Prep: Maximum Sterile Barrier Precautions used, chloraprep and alcohol swabs       Needles:  Injection technique: Single-shot  Needle Type: Stimulator Needle - 80        Needle insertion depth: 8 cm   Additional Needles:  Procedures: nerve stimulator Femoral nerve block  Nerve Stimulator or Paresthesia:  Response: 0.5 mA, 0.1 ms, 8 cm  Additional Responses:   Narrative:  Start time: 02/27/2012 8:00 AM End time: 02/27/2012 8:06 AM Injection made incrementally with aspirations every 5 mL.  Performed by: Personally  Anesthesiologist: Maren Beach MD  Additional Notes: 25cc  0.5% Marcaine w/ epi w/o difficulty or discomfort.  GES   Procedure Name: Intubation Date/Time: 02/27/2012 8:29 AM Performed by: Elon Alas Pre-anesthesia Checklist: Patient identified, Timeout performed, Emergency Drugs available, Patient being monitored and Suction available Patient Re-evaluated:Patient Re-evaluated prior to inductionOxygen Delivery Method: Circle system utilized Preoxygenation: Pre-oxygenation with 100% oxygen Intubation Type: IV induction and Cricoid Pressure applied Ventilation: Mask ventilation without difficulty and Oral airway inserted - appropriate to patient size Laryngoscope Size: Mac and 3 Grade View: Grade I Tube type: Oral Tube size: 7.0 mm Number of attempts: 1 Airway Equipment and Method: Stylet and LTA kit utilized Placement Confirmation: positive ETCO2,  ETT inserted through vocal cords under direct vision and breath sounds checked- equal and bilateral Secured at: 22 cm Tube secured with: Tape Dental Injury: Teeth and  Oropharynx as per pre-operative assessment

## 2012-02-27 NOTE — Brief Op Note (Signed)
02/27/2012  10:12 AM  PATIENT:  Crystal Potter  61 y.o. female  PRE-OPERATIVE DIAGNOSIS:  DJD LEFT KNEE  POST-OPERATIVE DIAGNOSIS:  DJD LEFT KNEE  PROCEDURE:  Procedure(s) (LRB): TOTAL KNEE ARTHROPLASTY (Left)  SURGEON:  Surgeon(s) and Role:    * Loreta Ave, MD - Primary  PHYSICIAN ASSISTANT: Zonia Kief M    ANESTHESIA:   regional and general  EBL:  Total I/O In: 1500 [I.V.:1500] Out: 175 [Urine:125; Blood:50]   SPECIMEN:  No Specimen  DISPOSITION OF SPECIMEN:  N/A  COUNTS:  YES  TOURNIQUET:   Total Tourniquet Time Documented: Thigh (Left) - 66 minutes  PATIENT DISPOSITION:  PACU - hemodynamically stable.

## 2012-02-28 LAB — PROTIME-INR
INR: 1.1 (ref 0.00–1.49)
Prothrombin Time: 14.4 seconds (ref 11.6–15.2)

## 2012-02-28 LAB — CBC
Hemoglobin: 10.9 g/dL — ABNORMAL LOW (ref 12.0–15.0)
MCH: 29.8 pg (ref 26.0–34.0)
MCHC: 32.8 g/dL (ref 30.0–36.0)
Platelets: 235 10*3/uL (ref 150–400)
RDW: 12.6 % (ref 11.5–15.5)

## 2012-02-28 LAB — BASIC METABOLIC PANEL
Calcium: 8.4 mg/dL (ref 8.4–10.5)
GFR calc Af Amer: >90 mL/min (ref >90)
GFR calc non Af Amer: >90 mL/min (ref >90)
Glucose, Bld: 106 mg/dL — ABNORMAL HIGH (ref 70–99)
Potassium: 3.8 mEq/L (ref 3.5–5.1)
Sodium: 139 mEq/L (ref 135–145)

## 2012-02-28 MED ORDER — WARFARIN SODIUM 7.5 MG PO TABS
7.5000 mg | ORAL_TABLET | Freq: Once | ORAL | Status: AC
  Administered 2012-02-28: 7.5 mg via ORAL
  Filled 2012-02-28: qty 1

## 2012-02-28 MED FILL — Morphine Sulfate Inj 4 MG/ML: INTRAMUSCULAR | Qty: 1 | Status: AC

## 2012-02-28 NOTE — Progress Notes (Addendum)
ANTICOAGULATION CONSULT NOTE - Initial Consult  Pharmacy Consult for Coumadin Indication: VTE prophylaxis  No Known Allergies  Patient Measurements: Height: 5\' 6"  (167.6 cm) (on 02/21/12) Weight: 262 lb 2 oz (118.899 kg) (on 02/21/12) IBW/kg (Calculated) : 59.3    Vital Signs: Temp: 97.6 F (36.4 C) (05/09 0508) BP: 127/62 mmHg (05/09 0508) Pulse Rate: 77  (05/09 0508)  Labs:Preadmission labs from 02/21/12 PT 12.5,  INR 0.91, PTT 31,  H/H 14.5/44,  PLTC 284K   Basename 02/28/12 0619  HGB 10.9*  HCT 33.2*  PLT 235  APTT --  LABPROT 14.4  INR 1.10  HEPARINUNFRC --  CREATININE 0.68  CKTOTAL --  CKMB --  TROPONINI --    Estimated Creatinine Clearance: 96.9 ml/min (by C-G formula based on Cr of 0.68). on 02/21/12   Medical History: Past Medical History  Diagnosis Date  . Bell's palsy 2005  . DI (detrusor instability)     urgency- fr time to time   . Fibroid   . GERD (gastroesophageal reflux disease)   . H/O hiatal hernia   . Neuromuscular disorder     Bells Palsy - R side of face - 2005  . Arthritis     knees, back, hand -R   . Anxiety     h/o panic attack- prior surgery for achilles   . Hypertension     followed by PCP, Dr. Wynelle Link    Medications:  Prescriptions prior to admission  Medication Sig Dispense Refill  . Ascorbic Acid (VITAMIN C PO) Take 1 tablet by mouth daily.      . Calcium Carbonate-Vitamin D (CALCIUM + D PO) Take 1 tablet by mouth daily.       . cholecalciferol (VITAMIN D) 1000 UNITS tablet Take 1,000 Units by mouth daily.        . DULoxetine (CYMBALTA) 60 MG capsule Take 60 mg by mouth every morning.       Marland Kitchen GLUCOSAMINE HCL-MSM PO Take 1 tablet by mouth 2 (two) times daily.      Marland Kitchen lisinopril-hydrochlorothiazide (PRINZIDE,ZESTORETIC) 20-12.5 MG per tablet Take 1 tablet by mouth 2 (two) times daily.       . Multiple Vitamin (MULTIVITAMIN) capsule Take 1 capsule by mouth daily.        . naproxen sodium (ANAPROX) 220 MG tablet Take 440 mg by mouth 2  (two) times daily with a meal.      . Omega-3 Fatty Acids (FISH OIL PO) Take 1 capsule by mouth daily.       . Omeprazole (PRILOSEC PO) Take by mouth.        . Probiotic Product (PROBIOTIC PO) Take 2 capsules by mouth daily.        Scheduled:     .  ceFAZolin (ANCEF) IV  1 g Intravenous Q8H  . coumadin book   Does not apply Once  . docusate sodium  100 mg Oral BID  . DULoxetine  60 mg Oral Daily  . enoxaparin  30 mg Subcutaneous Q12H  . pantoprazole  40 mg Oral Q1200  . pneumococcal 23 valent vaccine  0.5 mL Intramuscular Tomorrow-1000  . warfarin  7.5 mg Oral ONCE-1800  . warfarin   Does not apply Once  . Warfarin - Pharmacist Dosing Inpatient   Does not apply q1800  . DISCONTD: morphine   Intravenous Q4H    Assessment: INR is 1.1 today, POD#1 s/p L. TKA in this 61 y.o. Female.  Started on Coumadin last night for VTE prophylaxis. Also  on SQ Lovenox to DC when INR=/>1.8.  Hgb decreased to 10.9. Some bleeding thru dressing per Ortho note today. No other bleeding reported. Warfarin predictor point score = 4 suggests dose of 7.5mg  today.    Goal of Therapy:  INR 2-3    Plan:  Coumadin 7.5 mg today x1 Monitor INR qAM   Arman Filter, RPh 02/28/2012,10:27 AM

## 2012-02-28 NOTE — Progress Notes (Signed)
Subjective: Doing well.  Pain controlled.  Objective: Vital signs in last 24 hours: Temp:  [97.1 F (36.2 C)-98.7 F (37.1 C)] 97.6 F (36.4 C) (05/09 0508) Pulse Rate:  [77-96] 77  (05/09 0508) Resp:  [13-32] 14  (05/09 0508) BP: (127-160)/(62-84) 127/62 mmHg (05/09 0508) SpO2:  [89 %-100 %] 98 % (05/09 0508) FiO2 (%):  [2 %] 2 % (05/08 1145) Weight:  [118.899 kg (262 lb 2 oz)] 118.899 kg (262 lb 2 oz) (05/08 1133)  Intake/Output from previous day: 05/08 0701 - 05/09 0700 In: 2260 [P.O.:200; I.V.:1500; IV Piggyback:560] Out: 2780 [Urine:2625; Drains:105; Blood:50] Intake/Output this shift:     Basename 02/28/12 0619  HGB 10.9*    Basename 02/28/12 0619  WBC 10.4  RBC 3.66*  HCT 33.2*  PLT 235    Basename 02/28/12 0619  NA 139  K 3.8  CL 103  CO2 28  BUN 8  CREATININE 0.68  GLUCOSE 106*  CALCIUM 8.4    Basename 02/28/12 0619  LABPT --  INR 1.10    Exam:  Calf nt, nvi.  Some bleeding thru dressing.     Assessment/Plan: PT today.  Anticipate d/c home fri or sat.  D/c pca.     Crystal Potter M 02/28/2012, 9:26 AM

## 2012-02-28 NOTE — Progress Notes (Signed)
Physical Therapy Treatment Note   02/28/12 1348  PT Visit Information  Last PT Received On 02/28/12  Assistance Needed +1  PT Time Calculation  PT Start Time 1348  PT Stop Time 1413  PT Time Calculation (min) 25 min  Subjective Data  Subjective PT received sitting up in chair with reports of 5/10 L knee pain. Agreeable to amb  Precautions  Precautions Knee  Required Braces or Orthoses Knee Immobilizer - Left  Knee Immobilizer - Left On except when in CPM  Restrictions  LLE Weight Bearing WBAT  Cognition  Overall Cognitive Status Appears within functional limits for tasks assessed/performed  Arousal/Alertness Awake/alert  Orientation Level Oriented X4 / Intact  Behavior During Session [facility] for tasks performed  Bed Mobility  Bed Mobility Sit to Supine  Sit to Supine 4: Min assist;HOB flat  Details for Bed Mobility Assistance assist for L LE management  Transfers  Transfers Sit to Stand;Stand to Sit  Sit to Stand With upper extremity assist;4: Min guard;From chair/3-in-1;With armrests  Stand to Sit With upper extremity assist;4: Min guard;To bed  Ambulation/Gait  Ambulation/Gait Assistance 4: Min guard  Ambulation Distance (Feet) 100 Feet  Assistive device Rolling walker  Ambulation/Gait Assistance Details improved sequencing  Gait Pattern Step-to pattern;Decreased step length - left;Decreased stance time - left;Decreased stride length;Antalgic  Gait velocity improved from AM  Exercises  Exercises Total Joint  Total Joint Exercises  Quad Sets AROM;Left;10 reps;Seated (with LEs elevated)  Heel Slides AROM;10 reps;Seated (achieved 50 deg flexion)  Long Arc Quad AROM;Left;10 reps;Seated  PT - End of Session  Equipment Utilized During Treatment Gait belt;Left knee immobilizer  Activity Tolerance Patient tolerated treatment well  Patient left in bed;in CPM;with call bell/phone within reach (CPM set 0-70 degrees)  Nurse Communication (notified RN pt in CPM)  PT -  Assessment/Plan  Comments on Treatment Session Patient with great progress towards goals this PM. Will trial stair tomorrow AM prior to d/c. Patient with good home set up and support. Patient tolerating CPM and ther ex well.  PT Plan Discharge plan remains appropriate;Frequency remains appropriate  PT Frequency 7X/week  Follow Up Recommendations Home health PT;Supervision/Assistance - 24 hour  Equipment Recommended None recommended by PT  Acute Rehab PT Goals  Time For Goal Achievement 03/06/12  Potential to Achieve Goals Good  PT Goal: Sit to Supine/Side - Progress Progressing toward goal  PT Goal: Sit to Stand - Progress Progressing toward goal  PT Goal: Ambulate - Progress Progressing toward goal  PT Goal: Perform Home Exercise Program - Progress Progressing toward goal     Pain: 5/10 L knee  Lewis Shock, PT, DPT Pager #: [phone] Office #: [phone]

## 2012-02-28 NOTE — Progress Notes (Signed)
Physical Therapy Evaluation Note  Past Medical History  Diagnosis Date  . Bell's palsy 2005  . DI (detrusor instability)     urgency- fr time to time   . Fibroid   . GERD (gastroesophageal reflux disease)   . H/O hiatal hernia   . Neuromuscular disorder     Bells Palsy - R side of face - 2005  . Arthritis     knees, back, hand -R   . Anxiety     h/o panic attack- prior surgery for achilles   . Hypertension     followed by PCP, Dr. Wynelle Link   Past Surgical History  Procedure Date  . Abdominal hysterectomy 1995    TAH AND BLADDER NECK SUSPENSION  . Cholecystectomy 1975  . Breast surgery     REDUCTION MAMMAPLASTY  . Carpal tunnel release 1988  . Tubal ligation   . Eye lid surgery 2006    R eye, weighted for treatment fr. bell's palsy   . Achilles tendon surgery     L side   . Appendectomy     /w open cholecystectomy   . Lasik     2004     02/28/12 0753  PT Visit Information  Last PT Received On 02/28/12  Assistance Needed +1  PT Time Calculation  PT Start Time 0753  PT Stop Time 0830  PT Time Calculation (min) 37 min  Subjective Data  Subjective Pt received supine in bed with 4/10 L medial knee pain.  Precautions  Precautions Knee  Required Braces or Orthoses Knee Immobilizer - Left  Knee Immobilizer - Left On except when in CPM  Restrictions  Weight Bearing Restrictions Yes  LLE Weight Bearing WBAT  Home Living  Lives With Spouse  Available Help at Discharge Family;Available 24 hours/day  Type of Home House  Home Access Stairs to enter  Entrance Stairs-Number of Steps 4  Entrance Stairs-Rails None  Home Layout One level  Bathroom Shower/Tub Tub/shower unit;Curtain  Horticulturist, commercial Yes  How Accessible Accessible via walker (sidways)  Home Adaptive Equipment Bedside commode/3-in-1;Walker - rolling  Prior Function  Level of Independence Independent  Able to Take Stairs? 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  Communication  Communication No difficulties  Cognition  Overall Cognitive Status Appears within functional limits for tasks assessed/performed  Arousal/Alertness Awake/alert  Orientation Level Oriented X4 / Intact  Behavior During Session [facility] for tasks performed  Right Upper Extremity Assessment  RUE ROM/Strength/Tone WFL  Left Upper Extremity Assessment  LUE ROM/Strength/Tone WFL  Right Lower Extremity Assessment  RLE ROM/Strength/Tone WFL  Left Lower Extremity Assessment  LLE ROM/Strength/Tone Deficits;Due to pain  LLE ROM/Strength/Tone Deficits able to initiate quad set, achieved 25 degrees active knee flex, ankle/hip WFL  Trunk Assessment  Trunk Assessment Normal  Bed Mobility  Bed Mobility Supine to Sit  Supine to Sit 4: Min assist;HOB flat  Details for Bed Mobility Assistance v/'cs for technique to mimic home, minA initially with L LE  Transfers  Transfers Sit to Stand;Stand to Sit  Sit to Stand 4: Min assist;With upper extremity assist;From bed  Stand to Sit 4: Min assist;With upper extremity assist;To chair/3-in-1  Details for Transfer Assistance v/c's for technique and L LE management due to [employer]  Ambulation/Gait  Ambulation/Gait Assistance 4: Min guard  Ambulation Distance (Feet) 75 Feet  Assistive device Rolling walker  Ambulation/Gait Assistance Details v/c's initially for walker sequencing and to complete  quad set with L LE when in advancing R LE  Gait Pattern Step-to pattern;Decreased step length - left;Decreased stance time - left;Decreased stride length;Antalgic  Gait velocity slow  Stairs No  Exercises  Exercises Total Joint (handout provided)  Total Joint Exercises  Ankle Circles/Pumps AROM;Both;10 reps;Supine  Quad Sets Left;10 reps;Supine;AAROM  Knee Flexion AAROM;Left;5 reps;Supine  Goniometric ROM 25 degrees  PT - End of Session  Equipment Utilized During Treatment Gait belt;Left knee immobilizer  Activity Tolerance  Patient tolerated treatment well  Patient left in chair;with call bell/phone within reach  Nurse Communication Mobility status  CPM Left Knee  CPM Left Knee Off  PT Assessment  Clinical Impression Statement Pt s/p L TKA presenting with decreased L LE strength, L knee A/P ROM, and decreased ambulation tolerance. Patient requies increased assist and AD for all mobiltiy but reports spouse to be avail 24/7. Pt demonstrates good potential to return home with spouse when appropriate. Will trial steps tomorrow AM.  PT Recommendation/Assessment Patient needs continued PT services  PT Problem List Decreased strength;Decreased range of motion;Decreased activity tolerance;Decreased balance;Decreased knowledge of use of DME  Barriers to Discharge None  PT Therapy Diagnosis  Difficulty walking;Abnormality of gait;Generalized weakness;Acute pain  PT Plan  PT Frequency 7X/week  PT Treatment/Interventions DME instruction;Gait training;Stair training;Functional mobility training;Therapeutic activities;Therapeutic exercise  PT Recommendation  Follow Up Recommendations Home health PT;Supervision/Assistance - 24 hour  Equipment Recommended None recommended by PT (pt has RW and BSC)  Individuals Consulted  Consulted and Agree with Results and Recommendations Patient  Acute Rehab PT Goals  PT Goal Formulation With patient  Time For Goal Achievement 03/06/12  Potential to Achieve Goals Good  Pt will go Supine/Side to Sit with modified independence;with HOB 0 degrees  PT Goal: Supine/Side to Sit - Progress Goal set today  Pt will go Sit to Supine/Side with modified independence;with HOB 0 degrees  PT Goal: Sit to Supine/Side - Progress Goal set today  Pt will go Sit to Stand with modified independence (up to RW.)  PT Goal: Sit to Stand - Progress Goal set today  Pt will Ambulate >150 feet;with modified independence;with rolling walker  PT Goal: Ambulate - Progress Goal set today  Pt will Go Up / Down Stairs  3-5 stairs;with min assist (backwards with RW to enter home.)  PT Goal: Up/Down Stairs - Progress Goal set today  Pt will Perform Home Exercise Program Independently  PT Goal: Perform Home Exercise Program - Progress Goal set today  Written Expression  Dominant Hand Right    Pain: 5/10 L knee pain, patient pushed PCA pump x 2 during session.  Lewis Shock, PT, DPT Pager #: [phone] Office #: [phone]

## 2012-02-28 NOTE — Evaluation (Signed)
Occupational Therapy Evaluation Patient Details Name: Crystal Potter MRN: [id number] DOB: 19-May-1951 Today's Date: 02/28/2012 Time: 1191-4782 OT Time Calculation (min): 20 min  OT Assessment / Plan / Recommendation Clinical Impression  Pt. presents s/p left TKA and with increased pain.  Pt. will benefit from skilled OT to increase functional independence with ADLs and get to supervision level.    OT Assessment  Patient needs continued OT Services    Follow Up Recommendations  No OT follow up;Supervision - Intermittent    Barriers to Discharge None    Equipment Recommendations  None recommended by OT       Frequency  Min 2X/week    Precautions / Restrictions Precautions Precautions: Knee Required Braces or Orthoses: Knee Immobilizer - Left Knee Immobilizer - Left: On except when in CPM Restrictions Weight Bearing Restrictions: Yes LLE Weight Bearing: Weight bearing as tolerated   Pertinent Vitals/Pain 4/10 knee left    ADL  Eating/Feeding: Simulated;Independent Where Assessed - Eating/Feeding: Chair Grooming: Simulated;Wash/dry face;Set up;Supervision/safety Where Assessed - Grooming: Standing at sink Upper Body Bathing: Simulated;Chest;Right arm;Abdomen;Left arm;Set up Where Assessed - Upper Body Bathing: Sitting, chair Lower Body Bathing: Simulated;Minimal assistance Where Assessed - Lower Body Bathing: Sit to stand from chair Upper Body Dressing: Performed;Set up Where Assessed - Upper Body Dressing: Sitting, chair Lower Body Dressing: Simulated;Minimal assistance Where Assessed - Lower Body Dressing: Sit to stand from chair Toilet Transfer: Simulated;Minimal assistance Toilet Transfer Method: Ambulating Toilet Transfer Equipment: Other (comment) (straight back chair) Toileting - Clothing Manipulation: Simulated;Minimal assistance Where Assessed - Toileting Clothing Manipulation: Sit to stand from 3-in-1 or toilet Toileting - Hygiene: Simulated;Minimal  assistance Where Assessed - Toileting Hygiene: Sit to stand from 3-in-1 or toilet Tub/Shower Transfer: Not assessed Equipment Used: Rolling walker;Reacher Ambulation Related to ADLs: Pt. min assist with RW ~10'  ADL Comments: Pt. educated on techniques for completing LB ADLs with use of reacher.    OT Diagnosis: Acute pain  OT Problem List: Decreased activity tolerance;Impaired balance (sitting and/or standing);Decreased safety awareness;Decreased knowledge of use of DME or AE;Pain OT Treatment Interventions: Self-care/ADL training;DME and/or AE instruction;Therapeutic activities;Patient/family education;Balance training   OT Goals Acute Rehab OT Goals OT Goal Formulation: With patient Time For Goal Achievement: 03/06/12 Potential to Achieve Goals: Good ADL Goals Pt Will Perform Grooming: with set-up;with supervision;Standing at sink ADL Goal: Grooming - Progress: Goal set today Pt Will Perform Lower Body Bathing: with set-up;with supervision;Sit to stand from chair ADL Goal: Lower Body Bathing - Progress: Goal set today Pt Will Perform Lower Body Dressing: with set-up;with supervision;Sit to stand from chair;with adaptive equipment ADL Goal: Lower Body Dressing - Progress: Goal set today Pt Will Transfer to Toilet: with set-up;with supervision;3-in-1;with DME ADL Goal: Toilet Transfer - Progress: Goal set today Pt Will Perform Tub/Shower Transfer: Tub transfer;with supervision;Shower seat with back ADL Goal: Tub/Shower Transfer - Progress: Goal set today  Visit Information  Last OT Received On: 02/28/12 Assistance Needed: +1    Subjective Data  Subjective: "I would love to learn how to do for myself" Patient Stated Goal: "Go home"   Prior Functioning  Home Living Lives With: Spouse Available Help at Discharge: Family;Available 24 hours/day Type of Home: House Home Access: Stairs to enter [employer] of Steps: 4 Entrance Stairs-Rails: None Home Layout: One  level Bathroom Shower/Tub: Forensic scientist: Standard Bathroom Accessibility: Yes How Accessible: Accessible via walker Home Adaptive Equipment: Bedside commode/3-in-1;Walker - rolling Prior Function Level of Independence: Independent Able to Take Stairs?: Yes Driving: Yes  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 Communication Communication: No difficulties Dominant Hand: Right    Cognition  Overall Cognitive Status: Appears within functional limits for tasks assessed/performed Arousal/Alertness: Awake/alert Orientation Level: Oriented X4 / Intact Behavior During Session: WFL for tasks performed    Extremity/Trunk Assessment Right Upper Extremity Assessment RUE ROM/Strength/Tone: Within functional levels Left Upper Extremity Assessment LUE ROM/Strength/Tone: Within functional levels Right Lower Extremity Assessment RLE ROM/Strength/Tone: Within functional levels Left Lower Extremity Assessment LLE ROM/Strength/Tone: Deficits;Due to pain LLE ROM/Strength/Tone Deficits: able to initiate quad set, achieved 25 degrees active knee flex, ankle/hip WFL Trunk Assessment Trunk Assessment: Normal   Mobility Bed Mobility Bed Mobility: Supine to Sit Supine to Sit: 4: Min assist;HOB flat Details for Bed Mobility Assistance: v/'cs for technique to mimic home, minA initially with L LE Transfers Sit to Stand: 4: Min assist;With upper extremity assist;From bed Stand to Sit: 4: Min assist;With upper extremity assist;To chair/3-in-1 Details for Transfer Assistance: v/c's for technique and L LE management due to KI         End of Session OT - End of Session Equipment Utilized During Treatment: Gait belt Activity Tolerance: Patient tolerated treatment well Patient left: in chair;with call bell/phone within reach Nurse Communication: Mobility status CPM Left Knee CPM Left Knee: Off   Sybilla Malhotra, OTR/L Pager [phone] 02/28/2012, 11:03 AM

## 2012-02-29 ENCOUNTER — Encounter (HOSPITAL_COMMUNITY): Payer: Self-pay | Admitting: Orthopedic Surgery

## 2012-02-29 LAB — BASIC METABOLIC PANEL
BUN: 6 mg/dL (ref 6–23)
Chloride: 101 mEq/L (ref 96–112)
Creatinine, Ser: 0.73 mg/dL (ref 0.50–1.10)
GFR calc Af Amer: >90 mL/min (ref >90)
GFR calc non Af Amer: >90 mL/min (ref >90)
Potassium: 3.3 mEq/L — ABNORMAL LOW (ref 3.5–5.1)

## 2012-02-29 LAB — CBC
HCT: 31.5 % — ABNORMAL LOW (ref 36.0–46.0)
MCHC: 32.7 g/dL (ref 30.0–36.0)
Platelets: 201 10*3/uL (ref 150–400)
RDW: 12.6 % (ref 11.5–15.5)
WBC: 9 10*3/uL (ref 4.0–10.5)

## 2012-02-29 LAB — PROTIME-INR
INR: 1.33 (ref 0.00–1.49)
Prothrombin Time: 16.7 seconds — ABNORMAL HIGH (ref 11.6–15.2)

## 2012-02-29 MED ORDER — WARFARIN SODIUM 7.5 MG PO TABS
7.5000 mg | ORAL_TABLET | Freq: Once | ORAL | Status: DC
  Filled 2012-02-29: qty 1

## 2012-02-29 MED ORDER — WARFARIN SODIUM 7.5 MG PO TABS
7.5000 mg | ORAL_TABLET | Freq: Once | ORAL | Status: AC
  Administered 2012-02-29: 7.5 mg via ORAL
  Filled 2012-02-29: qty 1

## 2012-02-29 NOTE — Progress Notes (Signed)
Physical Therapy Note   02/29/12 1200  PT Visit Information  Last PT Received On 02/29/12  Assistance Needed +1  PT/OT Co-Evaluation/Treatment Yes  PT Time Calculation  PT Start Time 0800  PT Stop Time 0824  PT Time Calculation (min) 24 min  Subjective Data  Subjective pt very pleasant and ready for PT.    Precautions  Precautions Knee  Required Braces or Orthoses Knee Immobilizer - Left  Knee Immobilizer - Left On except when in CPM  Restrictions  Weight Bearing Restrictions Yes  LLE Weight Bearing WBAT  Cognition  Overall Cognitive Status Appears within functional limits for tasks assessed/performed  Arousal/Alertness Awake/alert  Orientation Level Oriented X4 / Intact  Behavior During Session [facility] for tasks performed  Bed Mobility  Bed Mobility Not assessed  Transfers  Transfers Sit to Stand;Stand to Sit  Sit to Stand 4: Min guard;With upper extremity assist;From bed  Stand to Sit 4: Min guard;With upper extremity assist;With armrests;To chair/3-in-1  Details for Transfer Assistance cues for LE positioning prior to sitting.    Ambulation/Gait  Ambulation/Gait Assistance 4: Min guard  Ambulation Distance (Feet) 150 Feet  Assistive device Rolling walker  Ambulation/Gait Assistance Details pt with improved step length and more fluid gait.  pt able to decrease WBing on RW.    Gait Pattern Step-through pattern  Stairs Yes  Stairs Assistance 4: Min assist  Stairs Assistance Details (indicate cue type and reason) cues for safe stair gait with RW.  pt performed very well.    Stair Management Technique Backwards;With walker  Number of Stairs 2   Wheelchair Mobility  Wheelchair Mobility No  Balance  Balance Assessed No  PT - End of Session  Equipment Utilized During Treatment Gait belt;Left knee immobilizer  Activity Tolerance Patient tolerated treatment well  Patient left in chair;with call bell/phone within reach  Nurse Communication Mobility status  PT - Assessment/Plan   Comments on Treatment Session pt presents with TKA.  pt moving very well this am and notes plan is for D/C to home later today.  pt good from PT standpoint for D/C home.    PT Plan Discharge plan remains appropriate;Frequency remains appropriate  PT Frequency 7X/week  Follow Up Recommendations Home health PT;Supervision/Assistance - 24 hour  Equipment Recommended None recommended by PT  Acute Rehab PT Goals  PT Goal: Sit to Stand - Progress Progressing toward goal  PT Goal: Ambulate - Progress Progressing toward goal  PT Goal: Up/Down Stairs - Progress Met    Rogen Porte, PT [phone]

## 2012-02-29 NOTE — Progress Notes (Signed)
Occupational Therapy Treatment Patient Details Name: Crystal Potter MRN: [id number] DOB: 1951/10/12 Today's Date: 02/29/2012 Time: 4098-1191 OT Time Calculation (min): 14 min  OT Assessment / Plan / Recommendation Comments on Treatment Session Pt. progressing very well, continues to need increased time during mobility however less assist required.    Follow Up Recommendations  No OT follow up;Supervision - Intermittent       Equipment Recommendations  None recommended by OT       Frequency Min 2X/week   Plan Discharge plan remains appropriate    Precautions / Restrictions Precautions Precautions: Knee Required Braces or Orthoses: Knee Immobilizer - Left Knee Immobilizer - Left: On except when in CPM Restrictions LLE Weight Bearing: Weight bearing as tolerated   Pertinent Vitals/Pain 4/10 knee left    ADL  Toilet Transfer: Simulated;Minimal assistance Toilet Transfer Method: Proofreader: Other (comment) Nurse, children's) Tub/Shower Transfer: Simulated;Minimal assistance Tub/Shower Transfer Method: Science writer: Shower seat with back Equipment Used: Rolling walker Ambulation Related to ADLs: Pt. min guard assist ~150' with RW ADL Comments: Pt. educated on safe technique for tub transfer with use of tub seat and safe hand placement with RW.      OT Goals Acute Rehab OT Goals OT Goal Formulation: With patient Time For Goal Achievement: 03/06/12 Potential to Achieve Goals: Good ADL Goals Pt Will Transfer to Toilet: with set-up;with supervision;3-in-1;with DME ADL Goal: Toilet Transfer - Progress: Progressing toward goals Pt Will Perform Tub/Shower Transfer: Tub transfer;with supervision;Shower seat with back ADL Goal: Tub/Shower Transfer - Progress: Progressing toward goals  Visit Information  Last OT Received On: 02/29/12 Assistance Needed: +1 PT/OT Co-Evaluation/Treatment: Yes             Mobility Transfers Sit  to Stand: With upper extremity assist;4: Min guard;From chair/3-in-1;With armrests Stand to Sit: With upper extremity assist;4: Min guard;To bed Details for Transfer Assistance: v/c's for technique and L LE management due to KI         End of Session OT - End of Session Equipment Utilized During Treatment: Gait belt Activity Tolerance: Patient tolerated treatment well Patient left: in chair;with call bell/phone within reach Nurse Communication: Mobility status CPM Left Knee CPM Left Knee: Off   Tegan Britain, OTR/l Pager [phone] 02/29/2012, 8:55 AM

## 2012-02-29 NOTE — Progress Notes (Signed)
Subjective: Doing very well.  Pain controlled.  Did long hall ambulation with therapy yesterday.  Wants to go home. Objective: Vital signs in last 24 hours: Temp:  [97.3 F (36.3 C)-99 F (37.2 C)] 97.3 F (36.3 C) (05/10 0510) Pulse Rate:  [88-98] 98  (05/10 0510) Resp:  [18] 18  (05/10 0510) BP: (125-154)/(66-76) 154/75 mmHg (05/10 0510) SpO2:  [95 %-98 %] 98 % (05/10 0510)  Intake/Output from previous day: 05/09 0701 - 05/10 0700 In: 385 [P.O.:130; I.V.:255] Out: 20 [Drains:20] Intake/Output this shift:     Basename 02/29/12 0600 02/28/12 0619  HGB 10.3* 10.9*    Basename 02/29/12 0600 02/28/12 0619  WBC 9.0 10.4  RBC 3.52* 3.66*  HCT 31.5* 33.2*  PLT 201 235    Basename 02/29/12 0600 02/28/12 0619  NA 138 139  K 3.3* 3.8  CL 101 103  CO2 29 28  BUN 6 8  CREATININE 0.73 0.68  GLUCOSE 106* 106*  CALCIUM 8.6 8.4    Basename 02/29/12 0600 02/28/12 0619  LABPT -- --  INR 1.33 1.10    Exam:  Wound looks good. Staples intact.  No signs of infection. Calf nt, nvi.  Drain removed.  Assessment/Plan: D/c home today after works with therapy.  F/u 2 weeks postop.     Chalyn Amescua M 02/29/2012, 9:07 AM

## 2012-02-29 NOTE — Progress Notes (Signed)
ANTICOAGULATION CONSULT NOTE - Follow Up Consult  Pharmacy Consult for Coumadin Indication: VTE prophylaxis  No Known Allergies  Patient Measurements: Height: 5\' 6"  (167.6 cm) (on 02/21/12) Weight: 262 lb 2 oz (118.899 kg) (on 02/21/12) IBW/kg (Calculated) : 59.3    Vital Signs: Temp: 97.3 F (36.3 C) (05/10 0510) BP: 154/75 mmHg (05/10 0510) Pulse Rate: 98  (05/10 0510)  Labs:  Basename 02/29/12 0600 02/28/12 0619  HGB 10.3* 10.9*  HCT 31.5* 33.2*  PLT 201 235  APTT -- --  LABPROT 16.7* 14.4  INR 1.33 1.10  HEPARINUNFRC -- --  CREATININE 0.73 0.68  CKTOTAL -- --  CKMB -- --  TROPONINI -- --    Estimated Creatinine Clearance: 96.9 ml/min (by C-G formula based on Cr of 0.73).    Assessment: Assessment: INR is 1.33 today, POD#2 s/p L. TKA in this  61 y.o. Female. Started on Coumadin 5/8 for VTE prophylaxis. Also on SQ Lovenox to DC when INR=/>1.8.  Hgb decreased to 10.3 Some bleeding thru dressing per Ortho note today. No other bleeding reported. Warfarin predictor point score = 4 suggests dose of 7.5mg  today.     Goal of Therapy:  INR 2-3   Plan:  Coumadin 7.5 mg today x1  Monitor INR qAM  Pt to be dced later today     Lucille Passy 02/29/2012,10:29 AM

## 2012-02-29 NOTE — Progress Notes (Signed)
CARE MANAGEMENT NOTE 02/29/2012  Patient:  Crystal Potter, Crystal Potter   Account Number:  [id number]  Date Initiated:  02/29/2012  Documentation initiated by:  Vance Peper  Subjective/Objective Assessment:   61 yr old female s/p left total knee arthroplasty     Action/Plan:   Patient was preoperatively setup with [facility], no changes. Rolling walker, CPM and 3in1 have been delivered to the home.   Anticipated DC Date:  02/29/2012   Anticipated DC Plan:  HOME W HOME HEALTH SERVICES      DC Planning Services  CM consult      [facility] Choice  HOME HEALTH   Choice offered to / List presented to:  C-1 Patient        HH arranged  HH-2 PT      [facility] agency  [facility]   Status of service:  Completed, signed off  Discharge Disposition:  HOME W HOME HEALTH SERVICES

## 2012-03-03 NOTE — Discharge Summary (Signed)
  ABBREVIATED DISCHARGE SUMMARY      DATE OF HOSPITALIZATION:  27 Feb 2012  REASON FOR HOSPITALIZATION:    61 yo wf with end stage djd left knee and pain.  Failed conservative treatment.     SIGNIFICANT FINDINGS:  DJD  OPERATION:  Left total knee replacement  FINAL DIAGNOSIS:  same  SECONDARY DIAGNOSIS: none  CONSULTANTS:  none  DISCHARGE CONDITION:  STABLE  DISCHARGED TO:  HOME

## 2012-03-31 ENCOUNTER — Encounter (INDEPENDENT_AMBULATORY_CARE_PROVIDER_SITE_OTHER): Payer: BC Managed Care – PPO

## 2012-03-31 ENCOUNTER — Other Ambulatory Visit: Payer: Self-pay | Admitting: Cardiology

## 2012-03-31 DIAGNOSIS — R229 Localized swelling, mass and lump, unspecified: Secondary | ICD-10-CM

## 2012-03-31 DIAGNOSIS — M7989 Other specified soft tissue disorders: Secondary | ICD-10-CM

## 2012-07-22 ENCOUNTER — Encounter: Payer: BC Managed Care – PPO | Admitting: Obstetrics and Gynecology

## 2012-08-12 ENCOUNTER — Encounter: Payer: Self-pay | Admitting: Obstetrics and Gynecology

## 2012-08-12 ENCOUNTER — Ambulatory Visit (INDEPENDENT_AMBULATORY_CARE_PROVIDER_SITE_OTHER): Payer: BC Managed Care – PPO | Admitting: Obstetrics and Gynecology

## 2012-08-12 VITALS — BP 140/84 | Ht 66.0 in | Wt 280.0 lb

## 2012-08-12 DIAGNOSIS — Z01419 Encounter for gynecological examination (general) (routine) without abnormal findings: Secondary | ICD-10-CM

## 2012-08-12 MED ORDER — DULOXETINE HCL 60 MG PO CPEP: 60.0000 mg | ORAL_CAPSULE | Freq: Every morning | ORAL | Status: AC

## 2012-08-12 MED ORDER — FESOTERODINE FUMARATE ER 8 MG PO TB24: 8.0000 mg | ORAL_TABLET | Freq: Every day | ORAL | Status: DC

## 2012-08-12 NOTE — Progress Notes (Signed)
Patient came to see me today for her annual GYN exam. She is status post total abdominal hysterectomy, bladder neck suspension done in 1995 for fibroids. She has been having detrussor instability. We treated her with Toviaz 4mg  but she still has nocturia which is significant. She does drink a lot of Water at night because she always has a dry mouth. She does not have incontinence of urination. She has always had normal Pap smears. Her last Pap smear was 2012. She is up-to-date on mammograms. She does bone densities through PCP N/A are normal. She was not having menopausal symptoms. She is having no vaginal bleeding. She is having no pelvic pain. We treat her with Cymbalta with excellent results. She does her lab through her PCP.  HEENT: Within normal limits.Crystal Potter present. Neck: No masses. Supraclavicular lymph nodes: Not enlarged. Breasts: Examined in both sitting and lying position. Symmetrical without skin changes or masses. Abdomen: Soft no masses guarding or rebound. No hernias. Pelvic: External within normal limits. BUS within normal limits. Vaginal examination shows good estrogen effect, no cystocele enterocele or rectocele. Cervix and uterus absent. Adnexa within normal limits. Rectovaginal confirmatory. Extremities within normal limits.  Assessment: Detrussor instability.  Plan: Increased Toviaz  to 8 mg daily. Continue Cymbalta. Continue yearly mammograms. Pap not done.The new Pap smear guidelines were discussed with the patient.

## 2012-08-12 NOTE — Patient Instructions (Signed)
Continue yearly mammograms 

## 2012-08-13 LAB — URINALYSIS W MICROSCOPIC + REFLEX CULTURE
Hgb urine dipstick: NEGATIVE
Ketones, ur: NEGATIVE mg/dL
Leukocytes, UA: NEGATIVE
Nitrite: NEGATIVE
Specific Gravity, Urine: 1.022 (ref 1.005–1.030)
Urobilinogen, UA: 0.2 mg/dL (ref 0.0–1.0)
pH: 6 (ref 5.0–8.0)

## 2012-08-14 LAB — URINE CULTURE

## 2012-10-21 ENCOUNTER — Encounter: Payer: Self-pay | Admitting: Obstetrics and Gynecology

## 2013-04-17 ENCOUNTER — Other Ambulatory Visit: Payer: Self-pay | Admitting: Family Medicine

## 2013-04-17 ENCOUNTER — Ambulatory Visit
Admission: RE | Admit: 2013-04-17 | Discharge: 2013-04-17 | Disposition: A | Discharge status: Home / Self Care | Payer: BC Managed Care – PPO | Source: Ambulatory Visit | Attending: Family Medicine | Admitting: Family Medicine

## 2013-04-17 DIAGNOSIS — R05 Cough: Secondary | ICD-10-CM

## 2013-10-20 ENCOUNTER — Encounter (HOSPITAL_COMMUNITY): Payer: Self-pay

## 2013-10-21 ENCOUNTER — Other Ambulatory Visit: Payer: Self-pay | Admitting: Physician Assistant

## 2013-10-21 NOTE — H&P (Signed)
TOTAL KNEE ADMISSION H&P  Patient is being admitted for right total knee arthroplasty.  Subjective:  Chief Complaint:right knee pain.  HPI: Crystal Potter, 62 y.o. female, has a history of pain and functional disability in the right knee due to arthritis and has failed non-surgical conservative treatments for greater than 12 weeks to includeNSAID's and/or analgesics, corticosteriod injections, flexibility and strengthening excercises and activity modification.  Onset of symptoms was gradual, starting 5 years ago with gradually worsening course since that time. The patient noted no past surgery on the right knee(s).  Patient currently rates pain in the right knee(s) at 10 out of 10 with activity. Patient has night pain, worsening of pain with activity and weight bearing, pain that interferes with activities of daily living, pain with passive range of motion, crepitus and joint swelling.  Patient has evidence of periarticular osteophytes and joint space narrowing by imaging studies. There is no active infection.  Patient Active Problem List   Diagnosis Date Noted  . GERD (gastroesophageal reflux disease) 07/18/2011  . Bell's palsy   . Fibroid   . DI (detrusor instability)    Past Medical History  Diagnosis Date  . Bell's palsy 2005  . DI (detrusor instability)     urgency- fr time to time   . Fibroid   . GERD (gastroesophageal reflux disease)   . H/O hiatal hernia   . Neuromuscular disorder     Bells Palsy - R side of face - 2005  . Arthritis     knees, back, hand -R   . Anxiety     h/o panic attack- prior surgery for achilles   . Hypertension     followed by PCP, Dr. Sun    Past Surgical History  Procedure Laterality Date  . Abdominal hysterectomy  1995    TAH AND BLADDER NECK SUSPENSION  . Cholecystectomy  1975  . Breast surgery      REDUCTION MAMMAPLASTY  . Carpal tunnel release  1988  . Tubal ligation    . Eye lid surgery  2006    R eye, weighted for treatment fr.  bell's palsy   . Achilles tendon surgery      L side   . Appendectomy      /w open cholecystectomy   . Lasik      2004  . Total knee arthroplasty  02/27/2012    Procedure: TOTAL KNEE ARTHROPLASTY;  Surgeon: Daniel F Murphy, MD;  Location: [facility] OR;  Service: Orthopedics;  Laterality: Left;     (Not in a hospital admission) No Known Allergies  History  Substance Use Topics  . Smoking status: Never Smoker   . Smokeless tobacco: Never Used  . Alcohol Use: Yes     Comment: rare    Family History  Problem Relation Age of Onset  . Cancer Mother     BONE CANCER  . Hypertension Mother   . Breast cancer Mother     Age 52  . Breast cancer Maternal Aunt     Age 50's  . Diabetes Maternal Grandmother   . Cancer Maternal Uncle     Colon cancer  . Diabetes Maternal Uncle   . Diabetes Paternal Aunt   . Colon cancer Neg Hx   . Anesthesia problems Neg Hx      Review of Systems  Constitutional: Negative.   HENT: Negative.   Eyes: Negative.   Respiratory: Negative.   Cardiovascular: Negative.   Gastrointestinal: Negative.   Genitourinary: Negative.     Musculoskeletal: Positive for joint pain.  Skin: Negative.   Neurological: Negative.   Endo/Heme/Allergies: Negative.   Psychiatric/Behavioral: Negative.     Objective:  Physical Exam  Constitutional: She is oriented to person, place, and time. She appears well-developed and well-nourished.  HENT:  Head: Normocephalic and atraumatic.  Eyes: Conjunctivae and EOM are normal. Pupils are equal, round, and reactive to light.  Neck: Neck supple.  Cardiovascular: Normal rate, regular rhythm and intact distal pulses.  Exam reveals no gallop and no friction rub.   No murmur heard. Respiratory: Effort normal and breath sounds normal. No respiratory distress. She has no wheezes. She has no rales.  GI: Soft. Bowel sounds are normal. There is no tenderness.  Musculoskeletal:  4+ patellofemoral crepitus. Varus to neutral. Stable ligaments.  Motion is 0-110. Not a lot of atrophy. Definite exogenous obesity but you can get to her knees fairly well. She is neurovascularly intact distally.  Neurological: She is alert and oriented to person, place, and time.  Skin: Skin is warm and dry.  Psychiatric: She has a normal mood and affect. Her behavior is normal. Judgment and thought content normal.    Vital signs in last 24 hours: @VSRANGES@  Labs:   Estimated body mass index is 45.21 kg/(m^2) as calculated from the following:   Height as of 08/12/12: 5' 6" (1.676 m).   Weight as of 08/12/12: 127.007 kg (280 lb).   Imaging Review Plain radiographs demonstrate severe degenerative joint disease of the right knee(s). The overall alignment isneutral. The bone quality appears to be fair for age and reported activity level.  Assessment/Plan:  End stage arthritis, right knee   The patient history, physical examination, clinical judgment of the provider and imaging studies are consistent with end stage degenerative joint disease of the right knee(s) and total knee arthroplasty is deemed medically necessary. The treatment options including medical management, injection therapy arthroscopy and arthroplasty were discussed at length. The risks and benefits of total knee arthroplasty were presented and reviewed. The risks due to aseptic loosening, infection, stiffness, patella tracking problems, thromboembolic complications and other imponderables were discussed. The patient acknowledged the explanation, agreed to proceed with the plan and consent was signed. Patient is being admitted for inpatient treatment for surgery, pain control, PT, OT, prophylactic antibiotics, VTE prophylaxis, progressive ambulation and ADL's and discharge planning. The patient is planning to be discharged home with home health services   

## 2013-10-26 NOTE — Pre-Procedure Instructions (Signed)
Crystal Potter  10/26/2013   Your procedure is scheduled on: Wednesday, January 14.  Report to [facility], Main Entrance/Entrance "A" at 8:45 AM.  Call this number if you have problems the morning of surgery: [phone]   Remember:   Do not eat food or drink liquids after midnight.   Take these medicines the morning of surgery with A SIP OF WATER: DULoxetine (CYMBALTA), esomeprazole (NEXIUM), loratadine (CLARITIN).  Stop taking vitamins and herbal medications Wednesday, January 7.   Do not wear jewelry, make-up or nail polish.  Do not wear lotions, powders, or perfumes. You may wear deodorant.  Do not shave 48 hours prior to surgery.   Do not bring valuables to the hospital.  [facility] is not responsible for any belongings or valuables.    Contacts, dentures or bridgework may not be worn into surgery.  Leave suitcase in the car. After surgery it may be brought to your room.  For patients admitted to the hospital, discharge time is determined by your treatment team.          Special Instructions: Shower using CHG 2 nights before surgery and the night before surgery.  If you shower the day of surgery use CHG.  Use special wash - you have one bottle of CHG for all showers.  You should use approximately 1/3 of the bottle for each shower.   Please read over the following fact sheets that you were given: Pain Booklet, Coughing and Deep Breathing, Blood Transfusion Information and Surgical Site Infection Prevention

## 2013-10-27 ENCOUNTER — Encounter (HOSPITAL_COMMUNITY): Payer: Self-pay

## 2013-10-27 ENCOUNTER — Emergency Department (INDEPENDENT_AMBULATORY_CARE_PROVIDER_SITE_OTHER)
Admission: EM | Admit: 2013-10-27 | Discharge: 2013-10-27 | Disposition: A | Discharge status: Home / Self Care | Payer: BC Managed Care – PPO | Source: Home / Self Care | Attending: Family Medicine | Admitting: Family Medicine

## 2013-10-27 ENCOUNTER — Encounter (HOSPITAL_COMMUNITY)
Admission: RE | Admit: 2013-10-27 | Discharge: 2013-10-27 | Disposition: A | Discharge status: Home / Self Care | Payer: BC Managed Care – PPO | Source: Ambulatory Visit | Attending: Orthopedic Surgery | Admitting: Orthopedic Surgery

## 2013-10-27 ENCOUNTER — Encounter (HOSPITAL_COMMUNITY): Payer: Self-pay | Admitting: Emergency Medicine

## 2013-10-27 ENCOUNTER — Emergency Department (INDEPENDENT_AMBULATORY_CARE_PROVIDER_SITE_OTHER): Payer: BC Managed Care – PPO

## 2013-10-27 DIAGNOSIS — S66819A Strain of other specified muscles, fascia and tendons at wrist and hand level, unspecified hand, initial encounter: Secondary | ICD-10-CM

## 2013-10-27 DIAGNOSIS — Z01810 Encounter for preprocedural cardiovascular examination: Secondary | ICD-10-CM | POA: Insufficient documentation

## 2013-10-27 DIAGNOSIS — Z01818 Encounter for other preprocedural examination: Secondary | ICD-10-CM | POA: Insufficient documentation

## 2013-10-27 DIAGNOSIS — S63599A Other specified sprain of unspecified wrist, initial encounter: Secondary | ICD-10-CM

## 2013-10-27 DIAGNOSIS — Z01812 Encounter for preprocedural laboratory examination: Secondary | ICD-10-CM | POA: Insufficient documentation

## 2013-10-27 HISTORY — DX: Personal history of urinary calculi: Z87.442

## 2013-10-27 LAB — TYPE AND SCREEN
ABO/RH(D): O POS
ANTIBODY SCREEN: NEGATIVE

## 2013-10-27 LAB — URINALYSIS, ROUTINE W REFLEX MICROSCOPIC
Bilirubin Urine: NEGATIVE
Glucose, UA: NEGATIVE mg/dL
HGB URINE DIPSTICK: NEGATIVE
Ketones, ur: NEGATIVE mg/dL
NITRITE: NEGATIVE
PROTEIN: NEGATIVE mg/dL
Specific Gravity, Urine: 1.012 (ref 1.005–1.030)
Urobilinogen, UA: 0.2 mg/dL (ref 0.0–1.0)
pH: 5.5 (ref 5.0–8.0)

## 2013-10-27 LAB — CBC WITH DIFFERENTIAL/PLATELET
Basophils Absolute: 0.1 10*3/uL (ref 0.0–0.1)
Basophils Relative: 1 % (ref 0–1)
EOS ABS: 0.3 10*3/uL (ref 0.0–0.7)
EOS PCT: 2 % (ref 0–5)
HEMATOCRIT: 44.1 % (ref 36.0–46.0)
Hemoglobin: 14.8 g/dL (ref 12.0–15.0)
LYMPHS ABS: 1.3 10*3/uL (ref 0.7–4.0)
LYMPHS PCT: 13 % (ref 12–46)
MCH: 31.4 pg (ref 26.0–34.0)
MCHC: 33.6 g/dL (ref 30.0–36.0)
MCV: 93.4 fL (ref 78.0–100.0)
MONO ABS: 0.8 10*3/uL (ref 0.1–1.0)
MONOS PCT: 8 % (ref 3–12)
Neutro Abs: 7.9 10*3/uL — ABNORMAL HIGH (ref 1.7–7.7)
Neutrophils Relative %: 76 % (ref 43–77)
Platelets: 348 10*3/uL (ref 150–400)
RBC: 4.72 MIL/uL (ref 3.87–5.11)
RDW: 13.8 % (ref 11.5–15.5)
WBC: 10.4 10*3/uL (ref 4.0–10.5)

## 2013-10-27 LAB — PROTIME-INR
INR: 0.95 (ref 0.00–1.49)
PROTHROMBIN TIME: 12.5 s (ref 11.6–15.2)

## 2013-10-27 LAB — URINE MICROSCOPIC-ADD ON

## 2013-10-27 LAB — COMPREHENSIVE METABOLIC PANEL
ALT: 59 U/L — AB (ref 0–35)
AST: 44 U/L — ABNORMAL HIGH (ref 0–37)
Albumin: 3.9 g/dL (ref 3.5–5.2)
Alkaline Phosphatase: 130 U/L — ABNORMAL HIGH (ref 39–117)
BUN: 9 mg/dL (ref 6–23)
CALCIUM: 9.9 mg/dL (ref 8.4–10.5)
CO2: 31 meq/L (ref 19–32)
CREATININE: 0.95 mg/dL (ref 0.50–1.10)
Chloride: 95 mEq/L — ABNORMAL LOW (ref 96–112)
GFR calc Af Amer: 73 mL/min — ABNORMAL LOW (ref >90)
GFR calc non Af Amer: 63 mL/min — ABNORMAL LOW (ref >90)
Glucose, Bld: 85 mg/dL (ref 70–99)
Potassium: 4.1 mEq/L (ref 3.7–5.3)
SODIUM: 139 meq/L (ref 137–147)
TOTAL PROTEIN: 8.2 g/dL (ref 6.0–8.3)
Total Bilirubin: 0.6 mg/dL (ref 0.3–1.2)

## 2013-10-27 LAB — APTT: aPTT: 30 seconds (ref 24–37)

## 2013-10-27 LAB — SURGICAL PCR SCREEN
MRSA, PCR: NEGATIVE
Staphylococcus aureus: NEGATIVE

## 2013-10-27 NOTE — Progress Notes (Signed)
10/27/13 1019  OBSTRUCTIVE SLEEP APNEA  Have you ever been diagnosed with sleep apnea through a sleep study? No  Do you snore loudly (loud enough to be heard through closed doors)?  1  Do you often feel tired, fatigued, or sleepy during the daytime? 0  Has anyone observed you stop breathing during your sleep? 1  Do you have, or are you being treated for high blood pressure? 1  BMI more than 35 kg/m2? 1  Age over 63 years old? 1  Neck circumference greater than 40 cm/18 inches? 0 (17)  Gender: 0  Obstructive Sleep Apnea Score 5  Score 4 or greater  Results sent to PCP

## 2013-10-27 NOTE — Pre-Procedure Instructions (Addendum)
Crystal Potter  10/27/2013   Your procedure is scheduled on: Wednesday, January 14.  Report to [facility], Main Entrance/Entrance "A" at 8:45 AM.  Call this number if you have problems the morning of surgery: [phone]   Remember:   Do not eat food or drink liquids after midnight.   Take these medicines the morning of surgery with A SIP OF WATER: DULoxetine (CYMBALTA), esomeprazole (NEXIUM), mirabegron           STOP all herbel meds, nsaids (aleve,naproxen,advil,ibuprofen) 5 days prior to surgery 10/30/13 including vitamins, cranberry, glucosamine- chondroitin, fish oil, probiotic.   Do not wear jewelry, make-up or nail polish.  Do not wear lotions, powders, or perfumes. You may wear deodorant.  Do not shave 48 hours prior to surgery.   Do not bring valuables to the hospital.  [facility] is not responsible for any belongings or valuables.    Contacts, dentures or bridgework may not be worn into surgery.  Leave suitcase in the car. After surgery it may be brought to your room.  For patients admitted to the hospital, discharge time is determined by your treatment team.          Special Instructions: Shower using CHG 2 nights before surgery and the night before surgery.  If you shower the day of surgery use CHG.  Use special wash - you have one bottle of CHG for all showers.  You should use approximately 1/3 of the bottle for each shower.   Please read over the following fact sheets that you were given: Pain Booklet, Coughing and Deep Breathing, Blood Transfusion Information and Surgical Site Infection Prevention

## 2013-10-27 NOTE — ED Provider Notes (Signed)
Crystal Potter is a 63 y.o. female who presents to Urgent Care today for left wrist pain x 2 weeks. Patient had a [place] about 2 weeks ago. She had initial pain and swelling which has resolved. She has continued pain at the distal left radius. The pain is worse with activity and better with rest. She has been using over-the-counter rigid wrist brace which seems to help. She denies any radiating pain weakness or numbness. She denies any pain in the anatomical snuff box.   Past Medical History  Diagnosis Date  . Bell's palsy 2005  . DI (detrusor instability)     urgency- fr time to time   . Fibroid   . GERD (gastroesophageal reflux disease)   . H/O hiatal hernia   . Neuromuscular disorder     Bells Palsy - R side of face - 2005  . Arthritis     knees, back, hand -R   . Anxiety     h/o panic attack- prior surgery for achilles   . Hypertension     followed by PCP, Dr. Nancy Fetter  . History of kidney stones    History  Substance Use Topics  . Smoking status: Never Smoker   . Smokeless tobacco: Never Used     Comment: occ alcohol  . Alcohol Use: Yes     Comment: rare   ROS as above Medications reviewed. No current facility-administered medications for this encounter.   Current Outpatient Prescriptions  Medication Sig Dispense Refill  . Ascorbic Acid (VITAMIN C) 1000 MG tablet Take 1,000 mg by mouth daily.      . Calcium Carbonate-Vitamin D (CALCIUM + D PO) Take by mouth.      Marland Kitchen CALCIUM-MAGNESIUM-ZINC PO Take 1 tablet by mouth 2 (two) times daily.      Marland Kitchen CRANBERRY PO Take 300 mg by mouth daily.      . DULoxetine (CYMBALTA) 60 MG capsule Take 1 capsule (60 mg total) by mouth every morning.  90 capsule  3  . esomeprazole (NEXIUM) 20 MG capsule Take 20 mg by mouth daily at 12 noon.      . ferrous sulfate 325 (65 FE) MG tablet Take 325 mg by mouth daily with breakfast.      . Glucosamine HCl-MSM (GLUCOSAMINE-MSM PO) Take 1,500 mg by mouth daily.      Marland Kitchen lisinopril-hydrochlorothiazide  (PRINZIDE,ZESTORETIC) 20-12.5 MG per tablet Take 1 tablet by mouth 2 (two) times daily.       Marland Kitchen loratadine (CLARITIN) 10 MG tablet Take 10 mg by mouth daily.      . mirabegron ER (MYRBETRIQ) 25 MG TB24 tablet Take 25 mg by mouth daily.      Marland Kitchen omega-3 acid ethyl esters (LOVAZA) 1 G capsule Take 1 g by mouth daily.      . Probiotic Product (PROBIOTIC PO) Take 2 capsules by mouth daily.         Exam:  BP 136/85  Pulse 98  Temp(Src) 97.7 F (36.5 C) (Oral)  Resp 16  SpO2 97% Gen: Well NAD LEFT WRIST IS NORMAL APPEARING WITHOUT ANY SWELLING OR REDNESS. Motion is intact. Capillary refill sensation is intact. Tender to palpation overlying the lunate and triquetrum. Nontender in the anatomical snuff box. Normal scaphoid motion with thumb flexion. Pulses are intact.   Dg Wrist Complete Left  10/27/2013   CLINICAL DATA:  Left wrist pain after fall.  EXAM: LEFT WRIST - COMPLETE 3+ VIEW  COMPARISON:  None.  FINDINGS: There is no evidence  of fracture or dislocation. There is no evidence of arthropathy or other focal bone abnormality. Soft tissues are unremarkable.  IMPRESSION: Normal left wrist.   Electronically Signed   By: Sabino Dick M.D.   On: 10/27/2013 12:45    Assessment and Plan: 63 y.o. female with wrist sprain.  No evidence of scaphoid fracture versus scapholunate disassociation.  Plan to continue wrist brace as needed. Additionally use over-the-counter pain medications for pain as needed recommend followup with hand surgery if not getting better. Discussed warning signs or symptoms. Please see discharge instructions. Patient expresses understanding.      Gregor Hams, MD 10/27/13 [phone]

## 2013-10-27 NOTE — Discharge Instructions (Signed)
Thank you for coming in today. Continue the wrist brace as needed If not getting better followup with Dr. Levell July office in 1-2 weeks.

## 2013-10-27 NOTE — ED Notes (Signed)
Pt c/o left wrist inj onset 12/20... Reports she was going up the stairs when she tripped and fell Pain increases w/movement and when lifting heavy objects Denies: swelling Pt is alert and is able to move left wrist at ease w/some mild d/c... No signs of acute distress.

## 2013-10-28 LAB — URINE CULTURE

## 2013-10-29 ENCOUNTER — Other Ambulatory Visit: Payer: Self-pay | Admitting: Physician Assistant

## 2013-10-29 NOTE — Progress Notes (Signed)
Anesthesia Chart Review:  Patient is a 63 year old female scheduled for right TKA on 11/04/13 by Dr. Kathryne Hitch.  History includes left TKA '13, morbid obesity, non-smoker, HTN, nephrolithiasis, anxiety, GERD, hiatal hernia, right Bell's Palsy '05, detrusor instability, hysterectomy with bladder suspension, breast reduction. PCP is listed as Dr. Donald Prose.  EKG on 10/27/13 showed SR with marked sinus arrhythmia, cannot rule out anterior infarct (age undetermined). Non-specific ST wave abnormality--may be related to artifact. R wave is slightly lower in V3 and sinus arrhythmia are new when compared to EKG from 03/14/11.  CXR on 04/17/13 showed: Stable cardiopulmonary appearance with stable scarring and bronchitic change. No new focal or acute abnormality identified.   Preoperative labs noted.  Urine culture showed Klebsiella pneumoniae. Dr. Debroah Loop PA Ria Comment is aware and will start patient on antibiotic therapy.  No CV symptoms documented at PAT.  No known history of CAD/MI/CHF or DM. Further evaluation by her assigned anesthesiologist on the day of surgery.  If no acute changes or new CV symptomology then I would anticipate that she could proceed as planned.  [facility]/Anesthesiology Phone [phone] 10/29/2013 4:27 PM

## 2013-11-03 MED ORDER — DEXTROSE 5 % IV SOLN
3.0000 g | INTRAVENOUS | Status: AC
  Administered 2013-11-04: 3 g via INTRAVENOUS
  Filled 2013-11-03: qty 3000

## 2013-11-04 ENCOUNTER — Encounter (HOSPITAL_COMMUNITY)
Admission: RE | Disposition: A | Discharge status: Home Health | Payer: Self-pay | Source: Ambulatory Visit | Attending: Orthopedic Surgery

## 2013-11-04 ENCOUNTER — Inpatient Hospital Stay (HOSPITAL_COMMUNITY): Payer: BC Managed Care – PPO

## 2013-11-04 ENCOUNTER — Inpatient Hospital Stay (HOSPITAL_COMMUNITY): Payer: BC Managed Care – PPO | Admitting: Certified Registered"

## 2013-11-04 ENCOUNTER — Encounter (HOSPITAL_COMMUNITY): Payer: BC Managed Care – PPO | Admitting: Vascular Surgery

## 2013-11-04 ENCOUNTER — Encounter (HOSPITAL_COMMUNITY): Payer: Self-pay | Admitting: Certified Registered"

## 2013-11-04 ENCOUNTER — Inpatient Hospital Stay (HOSPITAL_COMMUNITY)
Admission: RE | Admit: 2013-11-04 | Discharge: 2013-11-06 | DRG: 470 | Disposition: A | Discharge status: Home Health | Payer: BC Managed Care – PPO | Source: Ambulatory Visit | Attending: Orthopedic Surgery | Admitting: Orthopedic Surgery

## 2013-11-04 DIAGNOSIS — Z96659 Presence of unspecified artificial knee joint: Secondary | ICD-10-CM

## 2013-11-04 DIAGNOSIS — M1710 Unilateral primary osteoarthritis, unspecified knee: Secondary | ICD-10-CM | POA: Diagnosis present

## 2013-11-04 DIAGNOSIS — F411 Generalized anxiety disorder: Secondary | ICD-10-CM | POA: Diagnosis present

## 2013-11-04 DIAGNOSIS — Z833 Family history of diabetes mellitus: Secondary | ICD-10-CM

## 2013-11-04 DIAGNOSIS — M179 Osteoarthritis of knee, unspecified: Secondary | ICD-10-CM | POA: Diagnosis present

## 2013-11-04 DIAGNOSIS — Z800 Family history of malignant neoplasm of digestive organs: Secondary | ICD-10-CM

## 2013-11-04 DIAGNOSIS — Z87442 Personal history of urinary calculi: Secondary | ICD-10-CM

## 2013-11-04 DIAGNOSIS — Z803 Family history of malignant neoplasm of breast: Secondary | ICD-10-CM

## 2013-11-04 DIAGNOSIS — Z6841 Body Mass Index (BMI) 40.0 and over, adult: Secondary | ICD-10-CM

## 2013-11-04 DIAGNOSIS — Z9851 Tubal ligation status: Secondary | ICD-10-CM

## 2013-11-04 DIAGNOSIS — Z808 Family history of malignant neoplasm of other organs or systems: Secondary | ICD-10-CM

## 2013-11-04 DIAGNOSIS — Z7982 Long term (current) use of aspirin: Secondary | ICD-10-CM

## 2013-11-04 DIAGNOSIS — M1712 Unilateral primary osteoarthritis, left knee: Secondary | ICD-10-CM

## 2013-11-04 DIAGNOSIS — K219 Gastro-esophageal reflux disease without esophagitis: Secondary | ICD-10-CM | POA: Diagnosis present

## 2013-11-04 DIAGNOSIS — Z9089 Acquired absence of other organs: Secondary | ICD-10-CM

## 2013-11-04 DIAGNOSIS — Z8249 Family history of ischemic heart disease and other diseases of the circulatory system: Secondary | ICD-10-CM

## 2013-11-04 DIAGNOSIS — Z79899 Other long term (current) drug therapy: Secondary | ICD-10-CM

## 2013-11-04 DIAGNOSIS — I10 Essential (primary) hypertension: Secondary | ICD-10-CM | POA: Diagnosis present

## 2013-11-04 HISTORY — PX: TOTAL KNEE ARTHROPLASTY: SHX125

## 2013-11-04 SURGERY — ARTHROPLASTY, KNEE, TOTAL
Anesthesia: General | Site: Knee | Laterality: Right

## 2013-11-04 MED ORDER — METHOCARBAMOL 100 MG/ML IJ SOLN
500.0000 mg | Freq: Four times a day (QID) | INTRAVENOUS | Status: DC | PRN
  Filled 2013-11-04: qty 5

## 2013-11-04 MED ORDER — CHLORHEXIDINE GLUCONATE 4 % EX LIQD: 60.0000 mL | Freq: Once | CUTANEOUS | Status: DC

## 2013-11-04 MED ORDER — LACTATED RINGERS IV SOLN
INTRAVENOUS | Status: DC
  Administered 2013-11-04: 10:00:00 via INTRAVENOUS

## 2013-11-04 MED ORDER — PROMETHAZINE HCL 25 MG/ML IJ SOLN: 6.2500 mg | INTRAMUSCULAR | Status: DC | PRN

## 2013-11-04 MED ORDER — ACETAMINOPHEN 650 MG RE SUPP: 650.0000 mg | Freq: Four times a day (QID) | RECTAL | Status: DC | PRN

## 2013-11-04 MED ORDER — CEFAZOLIN SODIUM-DEXTROSE 2-3 GM-% IV SOLR
2.0000 g | Freq: Four times a day (QID) | INTRAVENOUS | Status: AC
  Administered 2013-11-04: 2 g via INTRAVENOUS
  Filled 2013-11-04 (×2): qty 50

## 2013-11-04 MED ORDER — OXYCODONE-ACETAMINOPHEN 5-325 MG PO TABS
ORAL_TABLET | ORAL | Status: AC
  Administered 2013-11-04: 2 via ORAL
  Filled 2013-11-04: qty 2

## 2013-11-04 MED ORDER — ONDANSETRON HCL 4 MG/2ML IJ SOLN
INTRAMUSCULAR | Status: DC | PRN
  Administered 2013-11-04: 4 mg via INTRAVENOUS

## 2013-11-04 MED ORDER — DIPHENHYDRAMINE HCL 12.5 MG/5ML PO ELIX: 12.5000 mg | ORAL_SOLUTION | ORAL | Status: DC | PRN

## 2013-11-04 MED ORDER — ACETAMINOPHEN 325 MG PO TABS: 650.0000 mg | ORAL_TABLET | Freq: Four times a day (QID) | ORAL | Status: DC | PRN

## 2013-11-04 MED ORDER — ONDANSETRON HCL 4 MG/2ML IJ SOLN: 4.0000 mg | Freq: Four times a day (QID) | INTRAMUSCULAR | Status: DC | PRN

## 2013-11-04 MED ORDER — ONDANSETRON HCL 4 MG PO TABS: 4.0000 mg | ORAL_TABLET | Freq: Four times a day (QID) | ORAL | Status: DC | PRN

## 2013-11-04 MED ORDER — OXYCODONE-ACETAMINOPHEN 5-325 MG PO TABS: 1.0000 | ORAL_TABLET | ORAL | Status: DC | PRN

## 2013-11-04 MED ORDER — METOCLOPRAMIDE HCL 5 MG/ML IJ SOLN: 5.0000 mg | Freq: Three times a day (TID) | INTRAMUSCULAR | Status: DC | PRN

## 2013-11-04 MED ORDER — HYDROMORPHONE HCL PF 1 MG/ML IJ SOLN: 0.5000 mg | INTRAMUSCULAR | Status: DC | PRN

## 2013-11-04 MED ORDER — ASPIRIN EC 325 MG PO TBEC: 325.0000 mg | DELAYED_RELEASE_TABLET | Freq: Every day | ORAL | Status: DC

## 2013-11-04 MED ORDER — LISINOPRIL-HYDROCHLOROTHIAZIDE 20-12.5 MG PO TABS: 1.0000 | ORAL_TABLET | Freq: Two times a day (BID) | ORAL | Status: DC

## 2013-11-04 MED ORDER — ASPIRIN EC 325 MG PO TBEC
325.0000 mg | DELAYED_RELEASE_TABLET | Freq: Every day | ORAL | Status: DC
  Administered 2013-11-05 – 2013-11-06 (×2): 325 mg via ORAL
  Filled 2013-11-04 (×3): qty 1

## 2013-11-04 MED ORDER — METHOCARBAMOL 500 MG PO TABS
ORAL_TABLET | ORAL | Status: AC
  Administered 2013-11-04: 14:00:00 500 mg via ORAL
  Filled 2013-11-04: qty 1

## 2013-11-04 MED ORDER — MIDAZOLAM HCL 5 MG/5ML IJ SOLN
INTRAMUSCULAR | Status: DC | PRN
  Administered 2013-11-04 (×2): 1 mg via INTRAVENOUS

## 2013-11-04 MED ORDER — BUPIVACAINE HCL (PF) 0.25 % IJ SOLN
INTRAMUSCULAR | Status: AC
  Filled 2013-11-04: qty 30

## 2013-11-04 MED ORDER — LACTATED RINGERS IV SOLN: INTRAVENOUS | Status: DC

## 2013-11-04 MED ORDER — FENTANYL CITRATE 0.05 MG/ML IJ SOLN
INTRAMUSCULAR | Status: AC
  Filled 2013-11-04: qty 2

## 2013-11-04 MED ORDER — FENTANYL CITRATE 0.05 MG/ML IJ SOLN
INTRAMUSCULAR | Status: DC | PRN
  Administered 2013-11-04 (×2): 50 ug via INTRAVENOUS
  Administered 2013-11-04: 25 ug via INTRAVENOUS
  Administered 2013-11-04 (×6): 50 ug via INTRAVENOUS
  Administered 2013-11-04: 25 ug via INTRAVENOUS

## 2013-11-04 MED ORDER — MIDAZOLAM HCL 2 MG/2ML IJ SOLN
INTRAMUSCULAR | Status: AC
  Filled 2013-11-04: qty 2

## 2013-11-04 MED ORDER — PROPOFOL 10 MG/ML IV BOLUS
INTRAVENOUS | Status: DC | PRN
  Administered 2013-11-04: 200 mg via INTRAVENOUS

## 2013-11-04 MED ORDER — DULOXETINE HCL 60 MG PO CPEP
60.0000 mg | ORAL_CAPSULE | Freq: Every morning | ORAL | Status: DC
  Administered 2013-11-05 – 2013-11-06 (×2): 60 mg via ORAL
  Filled 2013-11-04 (×2): qty 1

## 2013-11-04 MED ORDER — ZOLPIDEM TARTRATE 5 MG PO TABS: 5.0000 mg | ORAL_TABLET | Freq: Every evening | ORAL | Status: DC | PRN

## 2013-11-04 MED ORDER — LIDOCAINE HCL (CARDIAC) 20 MG/ML IV SOLN
INTRAVENOUS | Status: DC | PRN
Start: 2013-11-04 — End: 2013-11-04
  Administered 2013-11-04: 40 mg via INTRAVENOUS

## 2013-11-04 MED ORDER — BUPIVACAINE HCL (PF) 0.25 % IJ SOLN
INTRAMUSCULAR | Status: DC | PRN
  Administered 2013-11-04: 5 mL

## 2013-11-04 MED ORDER — POTASSIUM CHLORIDE IN NACL 20-0.9 MEQ/L-% IV SOLN
INTRAVENOUS | Status: DC
  Administered 2013-11-04: 17:00:00 via INTRAVENOUS
  Administered 2013-11-05: 100 mL/h via INTRAVENOUS
  Filled 2013-11-04 (×4): qty 1000

## 2013-11-04 MED ORDER — LACTATED RINGERS IV SOLN
INTRAVENOUS | Status: DC | PRN
  Administered 2013-11-04 (×2): via INTRAVENOUS

## 2013-11-04 MED ORDER — DEXAMETHASONE SODIUM PHOSPHATE 10 MG/ML IJ SOLN
10.0000 mg | Freq: Three times a day (TID) | INTRAMUSCULAR | Status: AC
  Administered 2013-11-04: 10 mg via INTRAVENOUS
  Filled 2013-11-04 (×2): qty 1

## 2013-11-04 MED ORDER — DOCUSATE SODIUM 100 MG PO CAPS
100.0000 mg | ORAL_CAPSULE | Freq: Two times a day (BID) | ORAL | Status: DC
  Administered 2013-11-04 – 2013-11-06 (×4): 100 mg via ORAL
  Filled 2013-11-04 (×5): qty 1

## 2013-11-04 MED ORDER — LISINOPRIL 20 MG PO TABS
20.0000 mg | ORAL_TABLET | Freq: Two times a day (BID) | ORAL | Status: DC
  Administered 2013-11-04 – 2013-11-06 (×4): 20 mg via ORAL
  Filled 2013-11-04 (×5): qty 1

## 2013-11-04 MED ORDER — SODIUM CHLORIDE 0.9 % IV SOLN
10.0000 mg | INTRAVENOUS | Status: DC | PRN
  Administered 2013-11-04: 10 ug/min via INTRAVENOUS

## 2013-11-04 MED ORDER — METHOCARBAMOL 500 MG PO TABS: 500.0000 mg | ORAL_TABLET | Freq: Four times a day (QID) | ORAL | Status: DC

## 2013-11-04 MED ORDER — METHOCARBAMOL 500 MG PO TABS
500.0000 mg | ORAL_TABLET | Freq: Four times a day (QID) | ORAL | Status: DC | PRN
  Administered 2013-11-04 – 2013-11-06 (×2): 500 mg via ORAL
  Filled 2013-11-04 (×2): qty 1

## 2013-11-04 MED ORDER — HYDROMORPHONE HCL PF 1 MG/ML IJ SOLN
INTRAMUSCULAR | Status: AC
  Administered 2013-11-04: 0.5 mg via INTRAVENOUS
  Filled 2013-11-04: qty 1

## 2013-11-04 MED ORDER — SODIUM CHLORIDE 0.9 % IJ SOLN
INTRAMUSCULAR | Status: DC | PRN
  Administered 2013-11-04: 12:00:00

## 2013-11-04 MED ORDER — DEXAMETHASONE 6 MG PO TABS
10.0000 mg | ORAL_TABLET | Freq: Three times a day (TID) | ORAL | Status: AC
  Administered 2013-11-04 – 2013-11-05 (×2): 10 mg via ORAL
  Filled 2013-11-04 (×2): qty 1

## 2013-11-04 MED ORDER — OXYCODONE HCL 5 MG/5ML PO SOLN: 5.0000 mg | Freq: Once | ORAL | Status: DC | PRN

## 2013-11-04 MED ORDER — HYDROCHLOROTHIAZIDE 12.5 MG PO CAPS
12.5000 mg | ORAL_CAPSULE | Freq: Two times a day (BID) | ORAL | Status: DC
  Administered 2013-11-05 – 2013-11-06 (×3): 12.5 mg via ORAL
  Filled 2013-11-04 (×6): qty 1

## 2013-11-04 MED ORDER — CELECOXIB 200 MG PO CAPS
200.0000 mg | ORAL_CAPSULE | Freq: Two times a day (BID) | ORAL | Status: DC
  Administered 2013-11-04 – 2013-11-06 (×4): 200 mg via ORAL
  Filled 2013-11-04 (×5): qty 1

## 2013-11-04 MED ORDER — HYDROMORPHONE HCL PF 1 MG/ML IJ SOLN
0.2500 mg | INTRAMUSCULAR | Status: DC | PRN
  Administered 2013-11-04 (×2): 0.5 mg via INTRAVENOUS

## 2013-11-04 MED ORDER — PHENOL 1.4 % MT LIQD: 1.0000 | OROMUCOSAL | Status: DC | PRN

## 2013-11-04 MED ORDER — PANTOPRAZOLE SODIUM 40 MG PO TBEC
40.0000 mg | DELAYED_RELEASE_TABLET | Freq: Every day | ORAL | Status: DC
  Administered 2013-11-05 – 2013-11-06 (×2): 40 mg via ORAL
  Filled 2013-11-04 (×2): qty 1

## 2013-11-04 MED ORDER — DEXAMETHASONE SODIUM PHOSPHATE 10 MG/ML IJ SOLN
INTRAMUSCULAR | Status: AC
  Administered 2013-11-04: 14:00:00 10 mg via INTRAVENOUS
  Filled 2013-11-04: qty 1

## 2013-11-04 MED ORDER — MIRABEGRON ER 25 MG PO TB24
25.0000 mg | ORAL_TABLET | Freq: Every day | ORAL | Status: DC
  Administered 2013-11-05 – 2013-11-06 (×2): 25 mg via ORAL
  Filled 2013-11-04 (×2): qty 1

## 2013-11-04 MED ORDER — MENTHOL 3 MG MT LOZG: 1.0000 | LOZENGE | OROMUCOSAL | Status: DC | PRN

## 2013-11-04 MED ORDER — FERROUS SULFATE 325 (65 FE) MG PO TABS
325.0000 mg | ORAL_TABLET | Freq: Every day | ORAL | Status: DC
  Administered 2013-11-05 – 2013-11-06 (×2): 325 mg via ORAL
  Filled 2013-11-04 (×3): qty 1

## 2013-11-04 MED ORDER — OXYCODONE HCL 5 MG PO TABS: 5.0000 mg | ORAL_TABLET | Freq: Once | ORAL | Status: DC | PRN

## 2013-11-04 MED ORDER — SODIUM CHLORIDE 0.9 % IR SOLN
Status: DC | PRN
  Administered 2013-11-04: 1000 mL

## 2013-11-04 MED ORDER — METOCLOPRAMIDE HCL 10 MG PO TABS: 5.0000 mg | ORAL_TABLET | Freq: Three times a day (TID) | ORAL | Status: DC | PRN

## 2013-11-04 MED ORDER — OXYCODONE-ACETAMINOPHEN 5-325 MG PO TABS
1.0000 | ORAL_TABLET | ORAL | Status: DC | PRN
  Administered 2013-11-04 – 2013-11-06 (×5): 2 via ORAL
  Filled 2013-11-04 (×4): qty 2

## 2013-11-04 MED ORDER — BUPIVACAINE LIPOSOME 1.3 % IJ SUSP
20.0000 mL | Freq: Once | INTRAMUSCULAR | Status: DC
  Filled 2013-11-04: qty 20

## 2013-11-04 SURGICAL SUPPLY — 82 items
APL SKNCLS STERI-STRIP NONHPOA (GAUZE/BANDAGES/DRESSINGS) ×1
BANDAGE ESMARK 6X9 LF (GAUZE/BANDAGES/DRESSINGS) ×1 IMPLANT
BENZOIN TINCTURE PRP APPL 2/3 (GAUZE/BANDAGES/DRESSINGS) ×3 IMPLANT
BLADE SAG 18X100X1.27 (BLADE) ×6 IMPLANT
BNDG CMPR 9X6 STRL LF SNTH (GAUZE/BANDAGES/DRESSINGS) ×1
BNDG CMPR MED 10X6 ELC LF (GAUZE/BANDAGES/DRESSINGS) ×1
BNDG ELASTIC 6X10 VLCR STRL LF (GAUZE/BANDAGES/DRESSINGS) ×3 IMPLANT
BNDG ESMARK 6X9 LF (GAUZE/BANDAGES/DRESSINGS) ×3
BOWL SMART MIX CTS (DISPOSABLE) ×3 IMPLANT
CEMENT BONE SIMPLEX SPEEDSET (Cement) ×6 IMPLANT
CLOSURE WOUND 1/2 X4 (GAUZE/BANDAGES/DRESSINGS) ×1
CLOTH BEACON ORANGE TIMEOUT ST (SAFETY) ×1 IMPLANT
COVER SURGICAL LIGHT HANDLE (MISCELLANEOUS) ×3 IMPLANT
CUFF TOURNIQUET SINGLE 34IN LL (TOURNIQUET CUFF) ×1 IMPLANT
CUFF TOURNIQUET SINGLE 44IN (TOURNIQUET CUFF) ×2 IMPLANT
DRAPE EXTREMITY T 121X128X90 (DRAPE) ×3 IMPLANT
DRAPE PROXIMA HALF (DRAPES) ×3 IMPLANT
DRAPE U-SHAPE 47X51 STRL (DRAPES) ×3 IMPLANT
DRSG PAD ABDOMINAL 8X10 ST (GAUZE/BANDAGES/DRESSINGS) ×3 IMPLANT
DURAPREP 26ML APPLICATOR (WOUND CARE) ×3 IMPLANT
ELECT CAUTERY BLADE 6.4 (BLADE) ×3 IMPLANT
ELECT REM PT RETURN 9FT ADLT (ELECTROSURGICAL) ×3
ELECTRODE REM PT RTRN 9FT ADLT (ELECTROSURGICAL) ×1 IMPLANT
EVACUATOR 1/8 PVC DRAIN (DRAIN) ×3 IMPLANT
FACESHIELD LNG OPTICON STERILE (SAFETY) ×6 IMPLANT
GAUZE XEROFORM 5X9 LF (GAUZE/BANDAGES/DRESSINGS) IMPLANT
GLOVE BIO SURGEON STRL SZ 6.5 (GLOVE) ×2 IMPLANT
GLOVE BIO SURGEON STRL SZ7.5 (GLOVE) IMPLANT
GLOVE BIO SURGEON STRL SZ8 (GLOVE) ×3 IMPLANT
GLOVE BIO SURGEON STRL SZ8.5 (GLOVE) ×4 IMPLANT
GLOVE BIO SURGEONS STRL SZ 6.5 (GLOVE) ×1
GLOVE BIOGEL M 7.0 STRL (GLOVE) IMPLANT
GLOVE BIOGEL PI IND STRL 7.0 (GLOVE) ×1 IMPLANT
GLOVE BIOGEL PI IND STRL 7.5 (GLOVE) IMPLANT
GLOVE BIOGEL PI INDICATOR 7.0 (GLOVE) ×2
GLOVE BIOGEL PI INDICATOR 7.5 (GLOVE)
GLOVE ORTHO TXT STRL SZ7.5 (GLOVE) ×5 IMPLANT
GOWN PREVENTION PLUS XLARGE (GOWN DISPOSABLE) IMPLANT
GOWN STRL NON-REIN LRG LVL3 (GOWN DISPOSABLE) ×2 IMPLANT
GOWN STRL REUS W/ TWL XL LVL3 (GOWN DISPOSABLE) IMPLANT
GOWN STRL REUS W/TWL 2XL LVL3 (GOWN DISPOSABLE) ×5 IMPLANT
GOWN STRL REUS W/TWL XL LVL3 (GOWN DISPOSABLE) ×6
HANDPIECE INTERPULSE COAX TIP (DISPOSABLE) ×3
IMMOBILIZER KNEE 22 UNIV (SOFTGOODS) ×3 IMPLANT
IMMOBILIZER KNEE 24 THIGH 36 (MISCELLANEOUS) IMPLANT
IMMOBILIZER KNEE 24 UNIV (MISCELLANEOUS)
KIT BASIN OR (CUSTOM PROCEDURE TRAY) ×3 IMPLANT
KIT ROOM TURNOVER OR (KITS) ×3 IMPLANT
KNEE/VIT E POLY LINER LEVEL 1B ×2 IMPLANT
MANIFOLD NEPTUNE II (INSTRUMENTS) ×3 IMPLANT
NDL HYPO 25GX1X1/2 BEV (NEEDLE) ×1 IMPLANT
NEEDLE 18GX1X1/2 (RX/OR ONLY) (NEEDLE) ×3 IMPLANT
NEEDLE HYPO 25GX1X1/2 BEV (NEEDLE) ×3 IMPLANT
NS IRRIG 1000ML POUR BTL (IV SOLUTION) ×3 IMPLANT
PACK TOTAL JOINT (CUSTOM PROCEDURE TRAY) ×3 IMPLANT
PAD ARMBOARD 7.5X6 YLW CONV (MISCELLANEOUS) ×4 IMPLANT
PAD CAST 4YDX4 CTTN HI CHSV (CAST SUPPLIES) ×1 IMPLANT
PADDING CAST COTTON 4X4 STRL (CAST SUPPLIES) ×3
PADDING CAST COTTON 6X4 STRL (CAST SUPPLIES) ×3 IMPLANT
SET HNDPC FAN SPRY TIP SCT (DISPOSABLE) ×1 IMPLANT
SPONGE GAUZE 4X4 12PLY (GAUZE/BANDAGES/DRESSINGS) ×3 IMPLANT
STAPLER VISISTAT 35W (STAPLE) ×1 IMPLANT
STRIP CLOSURE SKIN 1/2X4 (GAUZE/BANDAGES/DRESSINGS) ×3 IMPLANT
SUCTION FRAZIER TIP 10 FR DISP (SUCTIONS) ×3 IMPLANT
SUT MNCRL AB 4-0 PS2 18 (SUTURE) ×3 IMPLANT
SUT MON AB 2-0 CT1 36 (SUTURE) ×2 IMPLANT
SUT VIC AB 0 CT1 27 (SUTURE) ×3
SUT VIC AB 0 CT1 27XBRD ANBCTR (SUTURE) ×2 IMPLANT
SUT VIC AB 1 CT1 27 (SUTURE) ×3
SUT VIC AB 1 CT1 27XBRD ANBCTR (SUTURE) IMPLANT
SUT VIC AB 1 CT1 27XBRD ANTBC (SUTURE) IMPLANT
SUT VIC AB 1 CTX 36 (SUTURE) ×3
SUT VIC AB 1 CTX36XBRD ANBCTR (SUTURE) IMPLANT
SUT VIC AB 2-0 CT1 27 (SUTURE) ×3
SUT VIC AB 2-0 CT1 TAPERPNT 27 (SUTURE) ×2 IMPLANT
SUT VIC AB 3-0 PS1 18 (SUTURE)
SUT VIC AB 3-0 PS1 18XBRD (SUTURE) ×1 IMPLANT
SYR 50ML LL SCALE MARK (SYRINGE) ×3 IMPLANT
SYR CONTROL 10ML LL (SYRINGE) ×3 IMPLANT
TOWEL OR 17X24 6PK STRL BLUE (TOWEL DISPOSABLE) ×3 IMPLANT
TOWEL OR 17X26 10 PK STRL BLUE (TOWEL DISPOSABLE) ×3 IMPLANT
WATER STERILE IRR 1000ML POUR (IV SOLUTION) ×4 IMPLANT

## 2013-11-04 NOTE — Discharge Summary (Addendum)
Patient ID: Crystal Potter MRN: [id number] DOB/AGE: [birth year] 63 y.o.  Admit date: 11/04/2013 Discharge date: 11/06/2013  Admission Diagnoses:  Active Problems:   DJD (degenerative joint disease) of knee   Discharge Diagnoses:  Same  Past Medical History  Diagnosis Date  . Bell's palsy 2005  . DI (detrusor instability)     urgency- fr time to time   . Fibroid   . GERD (gastroesophageal reflux disease)   . H/O hiatal hernia   . Neuromuscular disorder     Bells Palsy - R side of face - 2005  . Arthritis     knees, back, hand -R   . Anxiety     h/o panic attack- prior surgery for achilles   . Hypertension     followed by PCP, Dr. Nancy Fetter  . History of kidney stones     Surgeries: Procedure(s): RIGHT TOTAL KNEE ARTHROPLASTY on 11/04/2013   Consultants:    Discharged Condition: Improved  Hospital Course: Crystal Potter is an 63 y.o. female who was admitted 11/04/2013 for operative treatment of<principal problem not specified>. Patient has severe unremitting pain that affects sleep, daily activities, and work/hobbies. After pre-op clearance the patient was taken to the operating room on 11/04/2013 and underwent  Procedure(s): RIGHT TOTAL KNEE ARTHROPLASTY.    Patient was given perioperative antibiotics:     Anti-infectives   Start     Dose/Rate Route Frequency Ordered Stop   11/04/13 1730  ceFAZolin (ANCEF) IVPB 2 g/50 mL premix     2 g 100 mL/hr over 30 Minutes Intravenous Every 6 hours 11/04/13 1533 11/05/13 0529   11/04/13 0600  ceFAZolin (ANCEF) 3 g in dextrose 5 % 50 mL IVPB     3 g 160 mL/hr over 30 Minutes Intravenous On call to O.R. 11/03/13 1404 11/04/13 1130       Patient was given sequential compression devices, early ambulation, and chemoprophylaxis to prevent DVT.  Patient benefited maximally from hospital stay and there were no complications.    Recent vital signs:  Patient Vitals for the past 24 hrs:  BP Temp Pulse Resp SpO2  11/06/13 1100 110/60 mmHg  - - - -  11/06/13 0517 106/54 mmHg 97.7 F (36.5 C) 76 18 98 %  11/06/13 0000 - - - 16 -  11/05/13 2054 121/57 mmHg 98.1 F (36.7 C) 94 16 95 %  11/05/13 2000 - - - 16 -  11/05/13 1320 123/60 mmHg 97.8 F (36.6 C) 95 16 98 %     Recent laboratory studies:   Recent Labs  11/05/13 0246 11/06/13 0345  WBC 16.9* 18.7*  HGB 12.3 11.1*  HCT 37.6 34.5*  PLT 297 275  NA 135* 138  K 4.3 5.3  CL 96 100  CO2 23 27  BUN 12 15  CREATININE 0.89 0.88  GLUCOSE 168* 126*  CALCIUM 8.9 8.7     Discharge Medications:     Medication List         aspirin EC 325 MG tablet  Take 1 tablet (325 mg total) by mouth daily.     CALCIUM + D PO  Take by mouth.     CALCIUM-MAGNESIUM-ZINC PO  Take 1 tablet by mouth 2 (two) times daily.     CRANBERRY PO  Take 300 mg by mouth daily.     DULoxetine 60 MG capsule  Commonly known as:  CYMBALTA  Take 1 capsule (60 mg total) by mouth every morning.     esomeprazole 20 MG  capsule  Commonly known as:  NEXIUM  Take 20 mg by mouth daily at 12 noon.     ferrous sulfate 325 (65 FE) MG tablet  Take 325 mg by mouth daily with breakfast.     GLUCOSAMINE-MSM PO  Take 1,500 mg by mouth daily.     lisinopril-hydrochlorothiazide 20-12.5 MG per tablet  Commonly known as:  PRINZIDE,ZESTORETIC  Take 1 tablet by mouth 2 (two) times daily.     loratadine 10 MG tablet  Commonly known as:  CLARITIN  Take 10 mg by mouth daily.     methocarbamol 500 MG tablet  Commonly known as:  ROBAXIN  Take 1 tablet (500 mg total) by mouth 4 (four) times daily.     MYRBETRIQ 25 MG Tb24 tablet  Generic drug:  mirabegron ER  Take 25 mg by mouth daily.     omega-3 acid ethyl esters 1 G capsule  Commonly known as:  LOVAZA  Take 1 g by mouth daily.     oxyCODONE-acetaminophen 5-325 MG per tablet  Commonly known as:  ROXICET  Take 1-2 tablets by mouth every 4 (four) hours as needed for severe pain.     PROBIOTIC PO  Take 2 capsules by mouth daily.     vitamin  C 1000 MG tablet  Take 1,000 mg by mouth daily.        Diagnostic Studies: Dg Wrist Complete Left  10-29-13   CLINICAL DATA:  Left wrist pain after fall.  EXAM: LEFT WRIST - COMPLETE 3+ VIEW  COMPARISON:  None.  FINDINGS: There is no evidence of fracture or dislocation. There is no evidence of arthropathy or other focal bone abnormality. Soft tissues are unremarkable.  IMPRESSION: Normal left wrist.   Electronically Signed   By: Sabino Dick M.D.   On: 10-29-13 12:45    Disposition: 01-Home or Self Care  Discharge Orders   Future Orders Complete By Expires   Call MD / Call 911  As directed    Comments:     If you experience chest pain or shortness of breath, CALL 911 and be transported to the hospital emergency room.  If you develope a fever above 101 F, pus (white drainage) or increased drainage or redness at the wound, or calf pain, call your surgeon's office.   Change dressing  As directed    Comments:     Change dressing on Sunday, then change the dressing daily with sterile 4 x 4 inch gauze dressing and apply TED hose.  You may clean the incision with alcohol prior to redressing.   Change dressing  As directed    Comments:     Change the dressing daily with sterile 4 x 4 inch gauze dressing and paper tape.  You may clean the incision with alcohol prior to redressing   Constipation Prevention  As directed    Comments:     Drink plenty of fluids.  Prune juice may be helpful.  You may use a stool softener, such as Colace (over the counter) 100 mg twice a day.  Use MiraLax (over the counter) for constipation as needed.   CPM  As directed    Comments:     Continuous passive motion machine (CPM):      Use the CPM from 0- to 60 for 6 hours per day.      You may increase by 10 per day.  You may break it up into 2 or 3 sessions per day.  Use CPM for 3-4 weeks or until you are told to stop.   Diet - low sodium heart healthy  As directed    Discharge instructions  As directed     Comments:     Total Knee Replacement Care After Refer to this sheet in the next few weeks. These discharge instructions provide you with general information on caring for yourself after you leave the hospital. Your caregiver may also give you specific instructions. Your treatment has been planned according to the most current medical practices available, but unavoidable complications sometimes occur. If you have any problems or questions after discharge, please call your caregiver. Regaining a near full range of motion of your knee within the first 3 to 6 weeks after surgery is critical. [place] may resume a normal diet and activities as directed.  Perform exercises as directed.  Place gray foam block, curve side up under heel at all times except when in CPM or when walking.  DO NOT modify, tear, cut, or change in any way the gray foam block. You will receive physical therapy daily  Take showers instead of baths until informed otherwise.  You may shower on Sunday.  Please wash whole leg including wound with soap and water  Change bandages (dressings)daily It is OK to take over-the-counter tylenol in addition to the oxycodone for pain, discomfort, or fever. Oxycodone is VERY constipating.  Please take stool softener twice a day and laxatives daily until bowels are regular Eat a well-balanced diet.  Avoid lifting or driving until you are instructed otherwise.  Make an appointment to see your caregiver for stitches (suture) or staple removal as directed.  If you have been sent home with a continuous passive motion machine (CPM machine), 0-90 degrees 6 hrs a day   2 hrs a shift SEEK MEDICAL CARE IF: You have swelling of your calf or leg.  You develop shortness of breath or chest pain.  You have redness, swelling, or increasing pain in the wound.  There is pus or any unusual drainage coming from the surgical site.  You notice a bad smell coming from the surgical site or dressing.   The surgical site breaks open after sutures or staples have been removed.  There is persistent bleeding from the suture or staple line.  You are getting worse or are not improving.  You have any other questions or concerns.  SEEK IMMEDIATE MEDICAL CARE IF:  You have a fever.  You develop a rash.  You have difficulty breathing.  You develop any reaction or side effects to medicines given.  Your knee motion is decreasing rather than improving.  MAKE SURE YOU:  Understand these instructions.  Will watch your condition.  Will get help right away if you are not doing well or get worse.   Do not put a pillow under the knee. Place it under the heel.  As directed    Comments:     Place gray foam under operative heel when in bed or in a chair to work on extension   Follow the hip precautions as taught in Physical Therapy  As directed    Comments:     Posterior total hip precautions.  Weight bearing as tolerated   Increase activity slowly as tolerated  As directed    TED hose  As directed    Comments:     Use stockings (TED hose) for 2 weeks on both leg(s).  You may remove them at night  for sleeping.   TED hose  As directed    Comments:     Use stockings (TED hose) for 3-4 weeks on both leg(s).  You may remove them at night for sleeping.      Follow-up Information   Follow up with [facility], MD. Schedule an appointment as soon as possible for a visit in 2 weeks.   Specialty:  Orthopedic Surgery   Contact information:   [address] [place] [address] [phone]        Signed: Larae Grooms 11/06/2013, 11:25 AM

## 2013-11-04 NOTE — Transfer of Care (Signed)
Immediate Anesthesia Transfer of Care Note  Patient: Crystal Potter  Procedure(s) Performed: Procedure(s): RIGHT TOTAL KNEE ARTHROPLASTY (Right)  Patient Location: PACU  Anesthesia Type:General  Level of Consciousness: awake and alert   Airway & Oxygen Therapy: Patient Spontanous Breathing and Patient connected to nasal cannula oxygen/face mask  Post-op Assessment: Report given to PACU RN and Post -op Vital signs reviewed and stable  Post vital signs: Reviewed and stable  Complications: No apparent anesthesia complications

## 2013-11-04 NOTE — Anesthesia Procedure Notes (Signed)
Procedure Name: LMA Insertion Date/Time: 11/04/2013 11:28 AM Performed by: Octavio Graves Pre-anesthesia Checklist: Patient identified, Timeout performed, Emergency Drugs available, Suction available and Patient being monitored Patient Re-evaluated:Patient Re-evaluated prior to inductionOxygen Delivery Method: Circle system utilized Preoxygenation: Pre-oxygenation with 100% oxygen Intubation Type: IV induction Ventilation: Mask ventilation without difficulty LMA: LMA inserted LMA Size: 4.0 Tube type: Oral Tube size: 4.0 mm Number of attempts: 1 Placement Confirmation: breath sounds checked- equal and bilateral and positive ETCO2 Tube secured with: Tape Dental Injury: Teeth and Oropharynx as per pre-operative assessment  Comments: IV induction Jackson - LMA Jackson atraumatic teeth and mouth as preop

## 2013-11-04 NOTE — Preoperative (Signed)
Beta Blockers   Reason not to administer Beta Blockers:Not Applicable 

## 2013-11-04 NOTE — Anesthesia Preprocedure Evaluation (Addendum)
Anesthesia Evaluation  Patient identified by MRN, date of birth, ID band Patient awake    Reviewed: Allergy & Precautions, H&P , NPO status , Patient's Chart, lab work & pertinent test results  History of Anesthesia Complications Negative for: history of anesthetic complications  Airway Mallampati: II TM Distance: >3 FB Neck ROM: Full    Dental  (+) Upper Dentures and Edentulous Upper   Pulmonary neg pulmonary ROS,  breath sounds clear to auscultation  Pulmonary exam normal       Cardiovascular hypertension, Pt. on medications Rhythm:Regular Rate:Normal     Neuro/Psych H/o Bell's Palsy    GI/Hepatic Neg liver ROS, GERD-  Medicated and Controlled,Elevated LFTs   Endo/Other  Morbid obesity  Renal/GU negative Renal ROS     Musculoskeletal   Abdominal (+) + obese,   Peds  Hematology   Anesthesia Other Findings   Reproductive/Obstetrics                         Anesthesia Physical Anesthesia Plan  ASA: III  Anesthesia Plan: General   Post-op Pain Management:    Induction: Intravenous  Airway Management Planned: LMA  Additional Equipment:   Intra-op Plan:   Post-operative Plan:   Informed Consent: I have reviewed the patients History and Physical, chart, labs and discussed the procedure including the risks, benefits and alternatives for the proposed anesthesia with the patient or authorized representative who has indicated his/her understanding and acceptance.   Dental advisory given  Plan Discussed with: CRNA and Surgeon  Anesthesia Plan Comments: (Plan routine monitors, GA-LMA OK Surgeon to use Exparel, prefers no FNB)        Anesthesia Quick Evaluation

## 2013-11-04 NOTE — Progress Notes (Signed)
Orthopedic Tech Progress Note Patient Details:  Crystal Potter 23-Apr-1951 [id number]  CPM Right Knee CPM Right Knee: On Right Knee Flexion (Degrees): 60 Right Knee Extension (Degrees): 0 Additional Comments: put ohf on bed  Ortho Devices Ortho Device/Splint Location: footsie roll Ortho Device/Splint Interventions: Ordered;Application   Braulio Bosch 11/04/2013, 4:38 PM

## 2013-11-04 NOTE — H&P (View-Only) (Signed)
TOTAL KNEE ADMISSION H&P  Patient is being admitted for right total knee arthroplasty.  Subjective:  Chief Complaint:right knee pain.  HPI: Crystal Potter, 63 y.o. female, has a history of pain and functional disability in the right knee due to arthritis and has failed non-surgical conservative treatments for greater than 12 weeks to includeNSAID's and/or analgesics, corticosteriod injections, flexibility and strengthening excercises and activity modification.  Onset of symptoms was gradual, starting 5 years ago with gradually worsening course since that time. The patient noted no past surgery on the right knee(s).  Patient currently rates pain in the right knee(s) at 10 out of 10 with activity. Patient has night pain, worsening of pain with activity and weight bearing, pain that interferes with activities of daily living, pain with passive range of motion, crepitus and joint swelling.  Patient has evidence of periarticular osteophytes and joint space narrowing by imaging studies. There is no active infection.  Patient Active Problem List   Diagnosis Date Noted  . GERD (gastroesophageal reflux disease) 07/18/2011  . Bell's palsy   . Fibroid   . DI (detrusor instability)    Past Medical History  Diagnosis Date  . Bell's palsy 2005  . DI (detrusor instability)     urgency- fr time to time   . Fibroid   . GERD (gastroesophageal reflux disease)   . H/O hiatal hernia   . Neuromuscular disorder     Bells Palsy - R side of face - 2005  . Arthritis     knees, back, hand -R   . Anxiety     h/o panic attack- prior surgery for achilles   . Hypertension     followed by PCP, Dr. Nancy Fetter    Past Surgical History  Procedure Laterality Date  . Abdominal hysterectomy  1995    TAH AND BLADDER NECK SUSPENSION  . Cholecystectomy  1975  . Breast surgery      REDUCTION MAMMAPLASTY  . Carpal tunnel release  1988  . Tubal ligation    . Eye lid surgery  2006    R eye, weighted for treatment fr.  bell's palsy   . Achilles tendon surgery      L side   . Appendectomy      /w open cholecystectomy   . Lasik      2004  . Total knee arthroplasty  02/27/2012    Procedure: TOTAL KNEE ARTHROPLASTY;  Surgeon: Ninetta Lights, MD;  Location: [facility];  Service: Orthopedics;  Laterality: Left;     (Not in a hospital admission) No Known Allergies  History  Substance Use Topics  . Smoking status: Never Smoker   . Smokeless tobacco: Never Used  . Alcohol Use: Yes     Comment: rare    Family History  Problem Relation Age of Onset  . Cancer Mother     BONE CANCER  . Hypertension Mother   . Breast cancer Mother     Age 51  . Breast cancer Maternal Aunt     Age 3's  . Diabetes Maternal Grandmother   . Cancer Maternal Uncle     Colon cancer  . Diabetes Maternal Uncle   . Diabetes Paternal Aunt   . Colon cancer Neg Hx   . Anesthesia problems Neg Hx      Review of Systems  Constitutional: Negative.   HENT: Negative.   Eyes: Negative.   Respiratory: Negative.   Cardiovascular: Negative.   Gastrointestinal: Negative.   Genitourinary: Negative.  Musculoskeletal: Positive for joint pain.  Skin: Negative.   Neurological: Negative.   Endo/Heme/Allergies: Negative.   Psychiatric/Behavioral: Negative.     Objective:  Physical Exam  Constitutional: She is oriented to person, place, and time. She appears well-developed and well-nourished.  HENT:  Head: Normocephalic and atraumatic.  Eyes: Conjunctivae and EOM are normal. Pupils are equal, round, and reactive to light.  Neck: Neck supple.  Cardiovascular: Normal rate, regular rhythm and intact distal pulses.  Exam reveals no gallop and no friction rub.   No murmur heard. Respiratory: Effort normal and breath sounds normal. No respiratory distress. She has no wheezes. She has no rales.  GI: Soft. Bowel sounds are normal. There is no tenderness.  Musculoskeletal:  4+ patellofemoral crepitus. Varus to neutral. Stable ligaments.  Motion is 0-110. Not a lot of atrophy. Definite exogenous obesity but you can get to her knees fairly well. She is neurovascularly intact distally.  Neurological: She is alert and oriented to person, place, and time.  Skin: Skin is warm and dry.  Psychiatric: She has a normal mood and affect. Her behavior is normal. Judgment and thought content normal.    Vital signs in last 24 hours: @VSRANGES @  Labs:   Estimated body mass index is 45.21 kg/(m^2) as calculated from the following:   Height as of 08/12/12: 5\' 6"  (1.676 m).   Weight as of 08/12/12: 127.007 kg (280 lb).   Imaging Review Plain radiographs demonstrate severe degenerative joint disease of the right knee(s). The overall alignment isneutral. The bone quality appears to be fair for age and reported activity level.  Assessment/Plan:  End stage arthritis, right knee   The patient history, physical examination, clinical judgment of the provider and imaging studies are consistent with end stage degenerative joint disease of the right knee(s) and total knee arthroplasty is deemed medically necessary. The treatment options including medical management, injection therapy arthroscopy and arthroplasty were discussed at length. The risks and benefits of total knee arthroplasty were presented and reviewed. The risks due to aseptic loosening, infection, stiffness, patella tracking problems, thromboembolic complications and other imponderables were discussed. The patient acknowledged the explanation, agreed to proceed with the plan and consent was signed. Patient is being admitted for inpatient treatment for surgery, pain control, PT, OT, prophylactic antibiotics, VTE prophylaxis, progressive ambulation and ADL's and discharge planning. The patient is planning to be discharged home with home health services

## 2013-11-04 NOTE — Progress Notes (Signed)
Dentures returned to pt

## 2013-11-04 NOTE — Interval H&P Note (Signed)
History and Physical Interval Note:  11/04/2013 8:27 AM  Crystal Potter  has presented today for surgery, with the diagnosis of DJD right knee  The various methods of treatment have been discussed with the patient and family. After consideration of risks, benefits and other options for treatment, the patient has consented to  Procedure(s): RIGHT TOTAL KNEE ARTHROPLASTY (Right) as a surgical intervention .  The patient's history has been reviewed, patient examined, no change in status, stable for surgery.  I have reviewed the patient's chart and labs.  Questions were answered to the patient's satisfaction.     Joice Nazario F

## 2013-11-04 NOTE — Progress Notes (Signed)
Post-op VS taken on arrival to [facility]N

## 2013-11-04 NOTE — Anesthesia Postprocedure Evaluation (Signed)
  Anesthesia Post-op Note  Patient: Crystal Potter  Procedure(s) Performed: Procedure(s): RIGHT TOTAL KNEE ARTHROPLASTY (Right)  Patient Location: PACU  Anesthesia Type:General  Level of Consciousness: awake and alert   Airway and Oxygen Therapy: Patient Spontanous Breathing  Post-op Pain: mild  Post-op Assessment: Post-op Vital signs reviewed  Post-op Vital Signs: stable  Complications: No apparent anesthesia complications

## 2013-11-04 NOTE — Progress Notes (Signed)
At the time of pt assessment found that her ace dressing was stained and sheets also stained with serous drainage Hemovac had been clamped around 1630 dressing was reinforced with gauge and ace wrapped changed Hemovac was unclamped at 2020 and charged and 50 mls emptied at 2220 dressing was ace wrap was cleaned and dry. Will continue to monitor. Arthor Captain LPN

## 2013-11-05 ENCOUNTER — Encounter (HOSPITAL_COMMUNITY): Payer: Self-pay | Admitting: Orthopedic Surgery

## 2013-11-05 LAB — CBC
HEMATOCRIT: 37.6 % (ref 36.0–46.0)
Hemoglobin: 12.3 g/dL (ref 12.0–15.0)
MCH: 31 pg (ref 26.0–34.0)
MCHC: 32.7 g/dL (ref 30.0–36.0)
MCV: 94.7 fL (ref 78.0–100.0)
Platelets: 297 10*3/uL (ref 150–400)
RBC: 3.97 MIL/uL (ref 3.87–5.11)
RDW: 14 % (ref 11.5–15.5)
WBC: 16.9 10*3/uL — ABNORMAL HIGH (ref 4.0–10.5)

## 2013-11-05 LAB — BASIC METABOLIC PANEL
BUN: 12 mg/dL (ref 6–23)
CO2: 23 mEq/L (ref 19–32)
CREATININE: 0.89 mg/dL (ref 0.50–1.10)
Calcium: 8.9 mg/dL (ref 8.4–10.5)
Chloride: 96 mEq/L (ref 96–112)
GFR calc non Af Amer: 68 mL/min — ABNORMAL LOW (ref >90)
GFR, EST AFRICAN AMERICAN: 79 mL/min — AB (ref >90)
Glucose, Bld: 168 mg/dL — ABNORMAL HIGH (ref 70–99)
Potassium: 4.3 mEq/L (ref 3.7–5.3)
Sodium: 135 mEq/L — ABNORMAL LOW (ref 137–147)

## 2013-11-05 NOTE — Progress Notes (Signed)
Subjective: 1 Day Post-Op Procedure(s) (LRB): RIGHT TOTAL KNEE ARTHROPLASTY (Right) Patient reports pain as 2 on 0-10 scale.  Pain is well controlled with percocet.  Patient denies any nausea/vomiting, fever/chills.  Positive flatus, but no BM yet.  Tolerating diet.  Objective: Vital signs in last 24 hours: Temp:  [97.7 F (36.5 C)-98.5 F (36.9 C)] 98.2 F (36.8 C) (01/15 0426) Pulse Rate:  [84-114] 97 (01/15 0426) Resp:  [13-20] 19 (01/15 0426) BP: (116-186)/(57-89) 128/64 mmHg (01/15 0426) SpO2:  [94 %-99 %] 99 % (01/15 0426)  Intake/Output from previous day: 01/14 0701 - 01/15 0700 In: 2626.7 [I.V.:2576.7; IV Piggyback:50] Out: 1025 [Urine:400; Drains:575; Blood:50] Intake/Output this shift:     Recent Labs  11/05/13 0246  HGB 12.3    Recent Labs  11/05/13 0246  WBC 16.9*  RBC 3.97  HCT 37.6  PLT 297    Recent Labs  11/05/13 0246  NA 135*  K 4.3  CL 96  CO2 23  BUN 12  CREATININE 0.89  GLUCOSE 168*  CALCIUM 8.9   No results found for this basename: LABPT, INR,  in the last 72 hours  Neurologically intact ABD soft Neurovascular intact Sensation intact distally Intact pulses distally Dorsiflexion/Plantar flexion intact Compartment soft Hemovac drain pulled by me today  Assessment/Plan: 1 Day Post-Op Procedure(s) (LRB): RIGHT TOTAL KNEE ARTHROPLASTY (Right) Advance diet Up with therapy D/C IV fluids Plan for discharge tomorrow Discharge home with home health  [place], M. Mendel Ryder 11/05/2013, 8:03 AM

## 2013-11-05 NOTE — Care Management Note (Signed)
CARE MANAGEMENT NOTE 11/05/2013  Patient:  Crystal Potter, Crystal Potter   Account Number:  [id number]  Date Initiated:  11/05/2013  Documentation initiated by:  Ricki Miller  Subjective/Objective Assessment:   63 yr old female s/p right total knee arthroplasty.     Action/Plan:   Case Manager spoke with patient concerning her home health and DME needs at Discharge. Patient preoperatively setup with [facility], no changes. She has rolling walker, 3in1. CPM to be delivered.   Anticipated DC Date:  11/06/2013   Anticipated DC Plan:  [place]  CM consult      PAC Choice  [place]   Choice offered to / List presented to:  C-1 Patient   DME arranged  CPM      DME agency  [employer]     [place] arranged  HH-2 PT      [place].   Status of service:  Completed, signed off Medicare Important Message given?   (If response is "NO", the following Medicare IM given date fields will be blank) Date Medicare IM given:   Date Additional Medicare IM given:    Discharge Disposition:  [place]  Per UR Regulation:

## 2013-11-05 NOTE — Op Note (Signed)
Crystal Potter, Crystal Potter NO.:  [id number]  MEDICAL RECORD NO.:  [id number]  LOCATION:  5N28C                        FACILITY:  [facility]  PHYSICIAN:  Ninetta Lights, M.D. DATE OF BIRTH:  1951/07/20  DATE OF PROCEDURE:  11/04/2013 DATE OF DISCHARGE:                              OPERATIVE REPORT   PREOPERATIVE DIAGNOSES:  Right knee end-stage degenerative arthritis, valgus alignment, exogenous obesity.  POSTOPERATIVE DIAGNOSES:  Right knee end-stage degenerative arthritis, valgus alignment, exogenous obesity.  PROCEDURE:  Right knee modified minimally invasive total knee replacement with Stryker triathlon prosthesis.  Soft tissue balancing. Cemented pegged posterior stabilized #5 femoral component.  Cemented #4 tibial component with 9-mm polyethylene insert.  Resurfacing pegged medial offset 35-mm patellar component.  SURGEON:  Ninetta Lights, M.D.  ASSISTANT:  Eula Listen, PA, present throughout the entire case and necessary for timely completion of procedure.  ANESTHESIA:  General.  BLOOD LOSS:  Minimal.  SPECIMENS:  None.  CULTURES:  None.  COMPLICATIONS:  None.  DRESSINGS:  Soft compressive knee immobilizer.  DRAIN:  Hemovac x1.  TOURNIQUET TIME:  1 hour and 15 minutes.  DESCRIPTION OF PROCEDURE:  The patient was brought to the operating room, placed on the operating table in a supine position.  After an adequate anesthesia had been obtained, tourniquet applied, prepped and draped in usual sterile fashion.  Exsanguinated with elevation of Esmarch.  Tourniquet inflated to 350 mmHg.  Straight incision above the patella down to the tibial tubercle.  Skin and subcutaneous tissue divided.  Hemostasis with cautery.  Everything little bit more difficult because of her obesity.  Medial arthrotomy, vastus splitting, preserving quad tendon.  Medial capsule released.  Grade 4 changes throughout. Marked erosion lateral patella and lateral trochlea.   Remnants of menisci, cruciate ligaments, loose bodies removed.  Distal femur exposed.  Intramedullary guide placed.  An 8-mm resection 5 degrees of valgus.  Using epicondylar axis, the femur was sized, cut, and fitted for a posterior stabilized pegged #4 component.  Proximal tibial resection below the defect medially.  Size #4 component.  Debris cleared throughout the knee.  Patella exposed.  Posterior 10 mm removed. Drilled sized her for a 35-mm component.  Debris cleared throughout. Copious irrigation throughout.  Trials put in place.  With the 9-mm insert, I was very pleased with balancing, motion, stability and tracking.  Tibia was marked for rotation and hand reamed.  All trials had been removed.  Copious irrigation with the pulse irrigating device. Cement prepared, placed on all components.  Firmly seated.  Polyethylene attached to tibia and knee.  Patella held with a clamp.  Once the cement had hardened, the knee was reexamined.  Full motion, good patellar tracking, well-balanced flexion and extension, and good stability. Wound irrigated.  Soft tissue was injected with Exparel.  Arthrotomy closed with #1 Vicryl.  Skin and subcutaneous tissue with subcutaneous and subcuticular closure without staples.  Prior to closure, Hemovac had been place.  Sterile compressive dressing applied.  Tourniquet deflated and removed.  Knee immobilizer applied.  Anesthesia reversed.  Brought to the recovery room.  Tolerated the surgery well.  No complications.     Ninetta Lights, M.D.  DFM/MEDQ  D:  11/04/2013  T:  11/05/2013  Job:  [id number]

## 2013-11-05 NOTE — Evaluation (Signed)
Physical Therapy Evaluation Patient Details Name: Crystal Potter MRN: [id number] DOB: 1951/05/31 Today's Date: 11/05/2013 Time: 0821-0850 PT Time Calculation (min): 29 min  PT Assessment / Plan / Recommendation History of Present Illness  pt presents with R TKA  Clinical Impression  Pt very motivated and anticipate great progress.  Pt has all needed DME and will have good support from family at D/C.  Will continue to follow.      PT Assessment  Patient needs continued PT services    Follow Up Recommendations  Home health PT;Supervision - Intermittent    Does the patient have the potential to tolerate intense rehabilitation      Barriers to Discharge        Equipment Recommendations  None recommended by PT    Recommendations for Other Services     Frequency 7X/week    Precautions / Restrictions Precautions Precautions: Fall;Knee Precaution Booklet Issued: Yes (comment) Required Braces or Orthoses: Knee Immobilizer - Right Knee Immobilizer - Right: On when out of bed or walking Restrictions Weight Bearing Restrictions: Yes RLE Weight Bearing: Weight bearing as tolerated   Pertinent Vitals/Pain 5/10 with mobility.  Premedicated.        Mobility  Bed Mobility Overal bed mobility: Needs Assistance Bed Mobility: Supine to Sit Supine to sit: Supervision;HOB elevated General bed mobility comments: pt demos good use of rail and is able to bring her own LE OOB.   Transfers Overall transfer level: Needs assistance Equipment used: Rolling walker (2 wheeled) Transfers: Sit to/from Stand Sit to Stand: Min guard General transfer comment: demos good use of UEs and controls descent to sitting.   Ambulation/Gait Ambulation/Gait assistance: Min guard Ambulation Distance (Feet): 120 Feet Assistive device: Rolling walker (2 wheeled) Gait Pattern/deviations: Decreased step length - left;Decreased stance time - right General Gait Details: demos good use of RW and very motivated  to ambulate.      Exercises Total Joint Exercises Ankle Circles/Pumps: AROM;Both;10 reps Long Arc Quad: AROM;Right;10 reps Knee Flexion: AROM;Right;10 reps   PT Diagnosis: Abnormality of gait  PT Problem List: Decreased strength;Decreased range of motion;Decreased activity tolerance;Decreased balance;Decreased mobility;Decreased knowledge of use of DME;Pain PT Treatment Interventions: DME instruction;Gait training;Stair training;Functional mobility training;Therapeutic activities;Therapeutic exercise;Balance training;Patient/family education     PT Goals(Current goals can be found in the care plan section) Acute Rehab PT Goals Patient Stated Goal: Home PT Goal Formulation: With patient Time For Goal Achievement: 11/12/13 Potential to Achieve Goals: Good  Visit Information  Last PT Received On: 11/05/13 Assistance Needed: +1 History of Present Illness: pt presents with R TKA       Prior Functioning  Home Living Family/patient expects to be discharged to:: Private residence Living Arrangements: Spouse/significant other Available Help at Discharge: Family;Available 24 hours/day Type of Home: House Home Access: Stairs to enter [employer] of Steps: 4 Entrance Stairs-Rails: None Home Layout: One level Home Equipment: Walker - 2 wheels;Bedside commode;Shower seat Prior Function Level of Independence: Independent Communication Communication: No difficulties Dominant Hand: Right    Cognition  Cognition Arousal/Alertness: Awake/alert Behavior During Therapy: WFL for tasks assessed/performed Overall Cognitive Status: Within Functional Limits for tasks assessed    Extremity/Trunk Assessment Upper Extremity Assessment Upper Extremity Assessment: Defer to OT evaluation Lower Extremity Assessment Lower Extremity Assessment: RLE deficits/detail RLE Deficits / Details: Demos good strength post-op AROM ~5 - 60   Balance    End of Session PT - End of Session Equipment  Utilized During Treatment: Gait belt;Right knee immobilizer Activity Tolerance: Patient tolerated  treatment well Patient left: in chair;with call bell/phone within reach Nurse Communication: Mobility status CPM Right Knee CPM Right Knee: Off  GP     Catarina Hartshorn, [place] 11/05/2013, 9:40 AM

## 2013-11-05 NOTE — Evaluation (Signed)
Occupational Therapy Evaluation Patient Details Name: Crystal Potter MRN: [id number] DOB: 11-10-1950 Today's Date: 11/05/2013 Time: 7253-6644 OT Time Calculation (min): 15 min  OT Assessment / Plan / Recommendation History of present illness pt presents with R TKA   Clinical Impression   Pt is moving very well POD 1 and has little pain.  This is her second TKA, the first was 3 years ago on her L knee.  Pt has all necessary equipment at home and will have assist of her husband upon d/c.  No further OT needs.   OT Assessment  Patient does not need any further OT services    Follow Up Recommendations  No OT follow up    Barriers to Discharge      Equipment Recommendations  None recommended by OT    Recommendations for Other Services    Frequency       Precautions / Restrictions Precautions Precautions: Fall;Knee Required Braces or Orthoses: Knee Immobilizer - Right Knee Immobilizer - Right: On when out of bed or walking Restrictions Weight Bearing Restrictions: Yes RLE Weight Bearing: Weight bearing as tolerated   Pertinent Vitals/Pain R knee described as "very little," VSS    ADL  Eating/Feeding: Independent Where Assessed - Eating/Feeding: Chair Grooming: Wash/dry hands;Min guard Where Assessed - Grooming: Unsupported standing Upper Body Bathing: Set up Where Assessed - Upper Body Bathing: Unsupported sitting Lower Body Bathing: Moderate assistance Where Assessed - Lower Body Bathing: Unsupported sitting;Supported sit to stand Upper Body Dressing: Set up Where Assessed - Upper Body Dressing: Unsupported sitting Lower Body Dressing: Moderate assistance Where Assessed - Lower Body Dressing: Unsupported sitting;Supported sit to stand Toilet Transfer: Min Psychiatric nurse Method: Sit to stand Toileting - Water quality scientist and Hygiene: Min guard Where Assessed - Toileting Clothing Manipulation and Hygiene: Sit to stand from 3-in-1 or toilet Equipment Used: Gait  belt;Rolling walker Transfers/Ambulation Related to ADLs: min guard assist with RW ADL Comments: Pt will rely on husband to assist with LB ADL.  Pt is aware of the availability of AE.  Pt does not yet have the knee ROM for stepping over the edge of the tub.  Educated on alternative technique.  Will likely wait until she is able to step over the edge as she did with her L TKA.    OT Diagnosis:    OT Problem List:   OT Treatment Interventions:     OT Goals(Current goals can be found in the care plan section) Acute Rehab OT Goals Patient Stated Goal: Home  Visit Information  Last OT Received On: 11/05/13 Assistance Needed: +1 History of Present Illness: pt presents with R TKA       Prior [place] expects to be discharged to:: Private residence Living Arrangements: Spouse/significant other Available Help at Discharge: Family;Available 24 hours/day Type of Home: House Home Access: Stairs to enter [employer] of Steps: 4 Entrance Stairs-Rails: None Home Layout: One level Home Equipment: Walker - 2 wheels;Bedside commode;Shower seat Prior Function Level of Independence: Independent Communication Communication: No difficulties Dominant Hand: Right         Vision/Perception Vision - History Patient Visual Report: No change from baseline   Cognition  Cognition Arousal/Alertness: Awake/alert Behavior During Therapy: WFL for tasks assessed/performed Overall Cognitive Status: Within Functional Limits for tasks assessed    Extremity/Trunk Assessment Upper Extremity Assessment Upper Extremity Assessment: Overall WFL for tasks assessed Lower Extremity Assessment Lower Extremity Assessment: Defer to PT evaluation Cervical / Trunk Assessment  Cervical / Trunk Assessment: Normal     Mobility Bed Mobility Overal bed mobility: Needs Assistance Bed Mobility: Sit to Supine Sit to supine: Min assist Transfers Overall transfer level:  Needs assistance Equipment used: Rolling walker (2 wheeled) Transfers: Sit to/from Stand Sit to Stand: Min guard     Exercise     Balance     End of Session OT - End of Session Activity Tolerance: Patient tolerated treatment well Patient left: in bed;with call bell/phone within reach   GO     Malka So 11/05/2013, 2:24 PM [phone]

## 2013-11-05 NOTE — Progress Notes (Signed)
Physical Therapy Treatment Patient Details Name: Crystal Potter MRN: [id number] DOB: [birth year] Today's Date: 11/05/2013 Time: 7829-5621 PT Time Calculation (min): 33 min  PT Assessment / Plan / Recommendation  History of Present Illness pt presents with R TKA   PT Comments   Great progress with PT.  Knee flexion is approximately 100 degrees so CPM should be adjusted accordingly.  Expect DC in am after PT session/stairs  Follow Up Recommendations  Home health PT;Supervision - Intermittent     Does the patient have the potential to tolerate intense rehabilitation     Barriers to Discharge        Equipment Recommendations  None recommended by PT    Recommendations for Other Services    Frequency 7X/week   Progress towards PT Goals Progress towards PT goals: Progressing toward goals  Plan Current plan remains appropriate    Precautions / Restrictions Precautions Precautions: Fall;Knee Required Braces or Orthoses: Knee Immobilizer - Right Knee Immobilizer - Right: On when out of bed or walking Restrictions Weight Bearing Restrictions: Yes RLE Weight Bearing: Weight bearing as tolerated   Pertinent Vitals/Pain See vitals tab Pain appears to be well managed    Mobility  Bed Mobility Overal bed mobility: Modified Independent Bed Mobility: Supine to Sit Supine to sit: Modified independent (Device/Increase time);HOB elevated Sit to supine: Min assist General bed mobility comments: Pt. improved to mod I level, using bed rail minimally and managing her own LE Transfers Overall transfer level: Needs assistance Equipment used: Rolling walker (2 wheeled) Transfers: Sit to/from Stand Sit to Stand: Min guard General transfer comment: good technique Ambulation/Gait Ambulation/Gait assistance: Supervision Ambulation Distance (Feet): 150 Feet Assistive device: Rolling walker (2 wheeled) Gait Pattern/deviations: Step-through pattern General Gait Details: Pt. able to walk 20 feet  without device and with only a minimal gait antalgia,  Good sequencing and full weightbearing  with device    Exercises Total Joint Exercises Ankle Circles/Pumps: AROM;Both;10 reps Quad Sets: AROM;10 reps;Right Short Arc Quad: AROM;Right;10 reps Hip ABduction/ADduction: AROM;Right;10 reps Straight Leg Raises: AROM;Right;10 reps Long Arc Quad: AROM;Right;10 reps Knee Flexion: AROM;Right;10 reps Goniometric ROM:  approximately 0 to 100   PT Diagnosis:    PT Problem List:   PT Treatment Interventions:     PT Goals (current goals can now be found in the care plan section) Acute Rehab PT Goals Patient Stated Goal: Home  Visit Information  Last PT Received On: 11/05/13 Assistance Needed: +1 History of Present Illness: pt presents with R TKA    Subjective Data  Subjective: Pt. reports she does not have any pain at this time. Patient Stated Goal: Home   Cognition  Cognition Arousal/Alertness: Awake/alert Behavior During Therapy: WFL for tasks assessed/performed Overall Cognitive Status: Within Functional Limits for tasks assessed    Balance     End of Session PT - End of Session Equipment Utilized During Treatment: Gait belt;Right knee immobilizer Activity Tolerance: Patient tolerated treatment well Patient left: in chair;with call bell/phone within reach Nurse Communication: Mobility status CPM Right Knee CPM Right Knee: On   GP     Ladona Ridgel 11/05/2013, 5:01 PM Gerlean Ren PT Acute Rehab Services [place] [phone]

## 2013-11-05 NOTE — Discharge Instructions (Signed)
Total Knee Replacement °Care After °Refer to this sheet in the next few weeks. These discharge instructions provide you with general information on caring for yourself after you leave the hospital. Your caregiver may also give you specific instructions. Your treatment has been planned according to the most current medical practices available, but unavoidable complications sometimes occur. If you have any problems or questions after discharge, please call your caregiver. Regaining a near full range of motion of your knee within the first 3 to 6 weeks after surgery is critical. °HOME CARE INSTRUCTIONS  °You may resume a normal diet and activities as directed.  °Perform exercises as directed.  °Place gray foam block, curve side up under heel at all times except when in CPM or when walking.  DO NOT modify, tear, cut, or change in any way the gray foam block. °You will receive physical therapy daily  °Take showers instead of baths until informed otherwise.  You may shower on Sunday.  Please wash whole leg including wound with soap and water  °Change bandages (dressings)daily °It is OK to take over-the-counter tylenol in addition to the oxycodone for pain, discomfort, or fever. °Oxycodone is VERY constipating.  Please take stool softener twice a day and laxatives daily until bowels are regular °Eat a well-balanced diet.  °Avoid lifting or driving until you are instructed otherwise.  °Make an appointment to see your caregiver for stitches (suture) or staple removal as directed.  °If you have been sent home with a continuous passive motion machine (CPM machine), 0-90 degrees 6 hrs a day   2 hrs a shift °SEEK MEDICAL CARE IF: °You have swelling of your calf or leg.  °You develop shortness of breath or chest pain.  °You have redness, swelling, or increasing pain in the wound.  °There is pus or any unusual drainage coming from the surgical site.  °You notice a bad smell coming from the surgical site or dressing.  °The surgical  site breaks open after sutures or staples have been removed.  °There is persistent bleeding from the suture or staple line.  °You are getting worse or are not improving.  °You have any other questions or concerns.  °SEEK IMMEDIATE MEDICAL CARE IF:  °You have a fever.  °You develop a rash.  °You have difficulty breathing.  °You develop any reaction or side effects to medicines given.  °Your knee motion is decreasing rather than improving.  °MAKE SURE YOU:  °Understand these instructions.  °Will watch your condition.  °Will get help right away if you are not doing well or get worse.  ° °Home Health physical therapy to be provided by [facility] [phone] °

## 2013-11-06 LAB — BASIC METABOLIC PANEL
BUN: 15 mg/dL (ref 6–23)
CHLORIDE: 100 meq/L (ref 96–112)
CO2: 27 meq/L (ref 19–32)
CREATININE: 0.88 mg/dL (ref 0.50–1.10)
Calcium: 8.7 mg/dL (ref 8.4–10.5)
GFR calc non Af Amer: 69 mL/min — ABNORMAL LOW (ref >90)
GFR, EST AFRICAN AMERICAN: 80 mL/min — AB (ref >90)
Glucose, Bld: 126 mg/dL — ABNORMAL HIGH (ref 70–99)
POTASSIUM: 5.3 meq/L (ref 3.7–5.3)
Sodium: 138 mEq/L (ref 137–147)

## 2013-11-06 LAB — CBC
HCT: 34.5 % — ABNORMAL LOW (ref 36.0–46.0)
Hemoglobin: 11.1 g/dL — ABNORMAL LOW (ref 12.0–15.0)
MCH: 30.7 pg (ref 26.0–34.0)
MCHC: 32.2 g/dL (ref 30.0–36.0)
MCV: 95.3 fL (ref 78.0–100.0)
Platelets: 275 10*3/uL (ref 150–400)
RBC: 3.62 MIL/uL — ABNORMAL LOW (ref 3.87–5.11)
RDW: 14.2 % (ref 11.5–15.5)
WBC: 18.7 10*3/uL — ABNORMAL HIGH (ref 4.0–10.5)

## 2013-11-06 MED ORDER — ASPIRIN EC 325 MG PO TBEC: 325.0000 mg | DELAYED_RELEASE_TABLET | Freq: Every day | ORAL | Status: DC

## 2013-11-06 NOTE — Progress Notes (Signed)
Physical Therapy Treatment Patient Details Name: Crystal Potter MRN: [id number] DOB: January 15, 1951 Today's Date: 11/06/2013 Time: 1037-1100 PT Time Calculation (min): 23 min  PT Assessment / Plan / Recommendation  History of Present Illness pt presents with R TKA   PT Comments   Pt. Has made exceptional progress following her knee replacement.  She is currently at 100 degrees of knee flexion and full extension.. She is able to SLR without lag at least 10 reps.  Goals met and she is anticipated to DC home shortly so will sign off.  She will f/u with HHPT.  Follow Up Recommendations  Home health PT;Supervision - Intermittent     Does the patient have the potential to tolerate intense rehabilitation     Barriers to Discharge        Equipment Recommendations  None recommended by PT    Recommendations for Other Services    Frequency 7X/week   Progress towards PT Goals Progress towards PT goals: Goals met/education completed, patient discharged from PT  Plan Current plan remains appropriate    Precautions / Restrictions Precautions Precautions: Fall;Knee Knee Immobilizer - Right: Other (comment) (did not use KI as pt can SLR with no lag x 10 reps) Restrictions Weight Bearing Restrictions: Yes RLE Weight Bearing: Weight bearing as tolerated   Pertinent Vitals/Pain See vitals tab Literally, denies pain    Mobility  Bed Mobility Overal bed mobility: Modified Independent Bed Mobility: Supine to Sit Supine to sit: Modified independent (Device/Increase time) General bed mobility comments: Pt. managing at mod I level with good technique and no need for assist, does not need increased time Transfers Overall transfer level: Modified independent Equipment used: Rolling walker (2 wheeled) Transfers: Sit to/from Stand Sit to Stand: Modified independent (Device/Increase time) General transfer comment: good technique, no overt LOB and appears safe in  transitions Ambulation/Gait Ambulation/Gait assistance: Modified independent (Device/Increase time) Ambulation Distance (Feet): 500 Feet Assistive device: Rolling walker (2 wheeled) Gait Pattern/deviations: Step-through pattern Stairs: Yes Stairs assistance: Min assist Stair Management: No rails;Forwards Number of Stairs: 5 General stair comments: Hand hold assist on right at min assist level, no overt LOB and good technique and safety demonstrated    Exercises Total Joint Exercises Ankle Circles/Pumps: AROM;Both;10 reps Quad Sets: AROM;10 reps;Right Straight Leg Raises: AROM;Right;10 reps Long Arc Quad: AROM;Right;10 reps Knee Flexion: AROM;Right;10 reps Goniometric ROM: 0 to 100 by goniometry   PT Diagnosis:    PT Problem List:   PT Treatment Interventions:     PT Goals (current goals can now be found in the care plan section)    Visit Information  Last PT Received On: 11/06/13 Assistance Needed: +1 History of Present Illness: pt presents with R TKA    Subjective Data  Subjective: Pt. says she has no pain   Cognition  Cognition Arousal/Alertness: Awake/alert Behavior During Therapy: WFL for tasks assessed/performed Overall Cognitive Status: Within Functional Limits for tasks assessed    Balance     End of Session PT - End of Session Activity Tolerance: Patient tolerated treatment well Patient left: in chair;with call bell/phone within reach Nurse Communication: Mobility status   GP     Ladona Ridgel 11/06/2013, 11:17 AM Gerlean Ren PT Acute Rehab Services [phone] [place] [phone]

## 2013-11-06 NOTE — Progress Notes (Signed)
Subjective: 2 Days Post-Op Procedure(s) (LRB): RIGHT TOTAL KNEE ARTHROPLASTY (Right) Patient reports pain as 1 on 0-10 scale.  Patient is doing very well.  She has been up with PT and is progressing nicely.  No nausea/vomiting, lightheadedness/dizziness, fever or chills.  Positive flatus and BM.  She feels like she is ready to go home today.  Objective: Vital signs in last 24 hours: Temp:  [97.7 F (36.5 C)-98.1 F (36.7 C)] 97.7 F (36.5 C) (01/16 0517) Pulse Rate:  [76-95] 76 (01/16 0517) Resp:  [16-18] 18 (01/16 0517) BP: (106-123)/(53-60) 106/54 mmHg (01/16 0517) SpO2:  [95 %-98 %] 98 % (01/16 0517)  Intake/Output from previous day: 01/15 0701 - 01/16 0700 In: 48 [P.O.:240; I.V.:250] Out: -  Intake/Output this shift:     Recent Labs  11/05/13 0246 11/06/13 0345  HGB 12.3 11.1*    Recent Labs  11/05/13 0246 11/06/13 0345  WBC 16.9* 18.7*  RBC 3.97 3.62*  HCT 37.6 34.5*  PLT 297 275    Recent Labs  11/05/13 0246 11/06/13 0345  NA 135* 138  K 4.3 5.3  CL 96 100  CO2 23 27  BUN 12 15  CREATININE 0.89 0.88  GLUCOSE 168* 126*  CALCIUM 8.9 8.7   No results found for this basename: LABPT, INR,  in the last 72 hours  Neurologically intact ABD soft Neurovascular intact Sensation intact distally Intact pulses distally Dorsiflexion/Plantar flexion intact Incision: moderate drainage No cellulitis present Compartment soft Dressing changed by me today.  Moderate amount of bloody drainage.    Assessment/Plan: 2 Days Post-Op Procedure(s) (LRB): RIGHT TOTAL KNEE ARTHROPLASTY (Right) Advance diet Up with therapy D/C IV fluids Discharge home with home health  [place], M. LINDSEY 11/06/2013, 8:05 AM

## 2014-08-23 ENCOUNTER — Encounter (HOSPITAL_COMMUNITY): Payer: Self-pay | Admitting: Orthopedic Surgery

## 2014-10-21 ENCOUNTER — Other Ambulatory Visit: Payer: Self-pay | Admitting: Dermatology

## 2015-01-31 ENCOUNTER — Other Ambulatory Visit (HOSPITAL_COMMUNITY)
Admission: RE | Admit: 2015-01-31 | Discharge: 2015-01-31 | Disposition: A | Discharge status: Home / Self Care | Payer: BLUE CROSS/BLUE SHIELD | Source: Ambulatory Visit | Attending: Family Medicine | Admitting: Family Medicine

## 2015-01-31 ENCOUNTER — Other Ambulatory Visit: Payer: Self-pay | Admitting: Family Medicine

## 2015-01-31 DIAGNOSIS — Z01419 Encounter for gynecological examination (general) (routine) without abnormal findings: Secondary | ICD-10-CM | POA: Diagnosis not present

## 2015-02-01 LAB — CYTOLOGY - PAP

## 2016-03-27 DIAGNOSIS — R350 Frequency of micturition: Secondary | ICD-10-CM | POA: Diagnosis not present

## 2016-03-27 DIAGNOSIS — N3946 Mixed incontinence: Secondary | ICD-10-CM | POA: Diagnosis not present

## 2016-05-04 DIAGNOSIS — R350 Frequency of micturition: Secondary | ICD-10-CM | POA: Diagnosis not present

## 2016-05-04 DIAGNOSIS — N3946 Mixed incontinence: Secondary | ICD-10-CM | POA: Diagnosis not present

## 2016-05-08 DIAGNOSIS — L821 Other seborrheic keratosis: Secondary | ICD-10-CM | POA: Diagnosis not present

## 2016-05-08 DIAGNOSIS — L300 Nummular dermatitis: Secondary | ICD-10-CM | POA: Diagnosis not present

## 2016-06-08 DIAGNOSIS — N3946 Mixed incontinence: Secondary | ICD-10-CM | POA: Diagnosis not present

## 2016-06-08 DIAGNOSIS — R350 Frequency of micturition: Secondary | ICD-10-CM | POA: Diagnosis not present

## 2016-08-01 DIAGNOSIS — I10 Essential (primary) hypertension: Secondary | ICD-10-CM | POA: Diagnosis not present

## 2016-08-01 DIAGNOSIS — Z0000 Encounter for general adult medical examination without abnormal findings: Secondary | ICD-10-CM | POA: Diagnosis not present

## 2016-08-01 DIAGNOSIS — K219 Gastro-esophageal reflux disease without esophagitis: Secondary | ICD-10-CM | POA: Diagnosis not present

## 2016-08-01 DIAGNOSIS — F411 Generalized anxiety disorder: Secondary | ICD-10-CM | POA: Diagnosis not present

## 2016-08-01 DIAGNOSIS — Z23 Encounter for immunization: Secondary | ICD-10-CM | POA: Diagnosis not present

## 2016-08-01 DIAGNOSIS — Z1389 Encounter for screening for other disorder: Secondary | ICD-10-CM | POA: Diagnosis not present

## 2016-08-01 DIAGNOSIS — N3281 Overactive bladder: Secondary | ICD-10-CM | POA: Diagnosis not present

## 2016-08-30 DIAGNOSIS — Z780 Asymptomatic menopausal state: Secondary | ICD-10-CM | POA: Diagnosis not present

## 2016-10-25 DIAGNOSIS — L570 Actinic keratosis: Secondary | ICD-10-CM | POA: Diagnosis not present

## 2016-10-25 DIAGNOSIS — L820 Inflamed seborrheic keratosis: Secondary | ICD-10-CM | POA: Diagnosis not present

## 2016-10-30 DIAGNOSIS — M79644 Pain in right finger(s): Secondary | ICD-10-CM | POA: Diagnosis not present

## 2016-11-19 DIAGNOSIS — N3946 Mixed incontinence: Secondary | ICD-10-CM | POA: Diagnosis not present

## 2016-11-19 DIAGNOSIS — R350 Frequency of micturition: Secondary | ICD-10-CM | POA: Diagnosis not present

## 2016-12-04 DIAGNOSIS — Z1231 Encounter for screening mammogram for malignant neoplasm of breast: Secondary | ICD-10-CM | POA: Diagnosis not present

## 2016-12-04 DIAGNOSIS — Z803 Family history of malignant neoplasm of breast: Secondary | ICD-10-CM | POA: Diagnosis not present

## 2017-01-14 DIAGNOSIS — N3946 Mixed incontinence: Secondary | ICD-10-CM | POA: Diagnosis not present

## 2017-01-14 DIAGNOSIS — R350 Frequency of micturition: Secondary | ICD-10-CM | POA: Diagnosis not present

## 2017-02-06 DIAGNOSIS — R7301 Impaired fasting glucose: Secondary | ICD-10-CM | POA: Diagnosis not present

## 2017-02-06 DIAGNOSIS — N3281 Overactive bladder: Secondary | ICD-10-CM | POA: Diagnosis not present

## 2017-02-06 DIAGNOSIS — F411 Generalized anxiety disorder: Secondary | ICD-10-CM | POA: Diagnosis not present

## 2017-02-06 DIAGNOSIS — I10 Essential (primary) hypertension: Secondary | ICD-10-CM | POA: Diagnosis not present

## 2017-02-06 DIAGNOSIS — Z1389 Encounter for screening for other disorder: Secondary | ICD-10-CM | POA: Diagnosis not present

## 2017-05-15 DIAGNOSIS — N3946 Mixed incontinence: Secondary | ICD-10-CM | POA: Diagnosis not present

## 2017-05-15 DIAGNOSIS — R350 Frequency of micturition: Secondary | ICD-10-CM | POA: Diagnosis not present

## 2017-08-05 DIAGNOSIS — Z1389 Encounter for screening for other disorder: Secondary | ICD-10-CM | POA: Diagnosis not present

## 2017-08-05 DIAGNOSIS — K219 Gastro-esophageal reflux disease without esophagitis: Secondary | ICD-10-CM | POA: Diagnosis not present

## 2017-08-05 DIAGNOSIS — I10 Essential (primary) hypertension: Secondary | ICD-10-CM | POA: Diagnosis not present

## 2017-08-05 DIAGNOSIS — F411 Generalized anxiety disorder: Secondary | ICD-10-CM | POA: Diagnosis not present

## 2017-08-05 DIAGNOSIS — Z23 Encounter for immunization: Secondary | ICD-10-CM | POA: Diagnosis not present

## 2017-08-05 DIAGNOSIS — Z0000 Encounter for general adult medical examination without abnormal findings: Secondary | ICD-10-CM | POA: Diagnosis not present

## 2017-09-02 DIAGNOSIS — L821 Other seborrheic keratosis: Secondary | ICD-10-CM | POA: Diagnosis not present

## 2017-09-11 DIAGNOSIS — M79622 Pain in left upper arm: Secondary | ICD-10-CM | POA: Diagnosis not present

## 2017-10-24 DIAGNOSIS — C44519 Basal cell carcinoma of skin of other part of trunk: Secondary | ICD-10-CM | POA: Diagnosis not present

## 2017-10-24 DIAGNOSIS — L812 Freckles: Secondary | ICD-10-CM | POA: Diagnosis not present

## 2017-10-24 DIAGNOSIS — D1801 Hemangioma of skin and subcutaneous tissue: Secondary | ICD-10-CM | POA: Diagnosis not present

## 2017-10-24 DIAGNOSIS — L821 Other seborrheic keratosis: Secondary | ICD-10-CM | POA: Diagnosis not present

## 2017-10-24 DIAGNOSIS — L820 Inflamed seborrheic keratosis: Secondary | ICD-10-CM | POA: Diagnosis not present

## 2017-10-24 DIAGNOSIS — L659 Nonscarring hair loss, unspecified: Secondary | ICD-10-CM | POA: Diagnosis not present

## 2017-12-04 DIAGNOSIS — J069 Acute upper respiratory infection, unspecified: Secondary | ICD-10-CM | POA: Diagnosis not present

## 2017-12-04 DIAGNOSIS — R400 Somnolence: Secondary | ICD-10-CM | POA: Diagnosis not present

## 2017-12-04 DIAGNOSIS — G478 Other sleep disorders: Secondary | ICD-10-CM | POA: Diagnosis not present

## 2017-12-04 DIAGNOSIS — R0683 Snoring: Secondary | ICD-10-CM | POA: Diagnosis not present

## 2017-12-04 DIAGNOSIS — I10 Essential (primary) hypertension: Secondary | ICD-10-CM | POA: Diagnosis not present

## 2017-12-04 DIAGNOSIS — R0681 Apnea, not elsewhere classified: Secondary | ICD-10-CM | POA: Diagnosis not present

## 2017-12-04 DIAGNOSIS — F411 Generalized anxiety disorder: Secondary | ICD-10-CM | POA: Diagnosis not present

## 2017-12-09 DIAGNOSIS — Z1231 Encounter for screening mammogram for malignant neoplasm of breast: Secondary | ICD-10-CM | POA: Diagnosis not present

## 2018-01-07 DIAGNOSIS — J209 Acute bronchitis, unspecified: Secondary | ICD-10-CM | POA: Diagnosis not present

## 2018-01-08 DIAGNOSIS — G4733 Obstructive sleep apnea (adult) (pediatric): Secondary | ICD-10-CM | POA: Diagnosis not present

## 2018-01-21 DIAGNOSIS — G4733 Obstructive sleep apnea (adult) (pediatric): Secondary | ICD-10-CM | POA: Diagnosis not present

## 2018-02-10 DIAGNOSIS — I10 Essential (primary) hypertension: Secondary | ICD-10-CM | POA: Diagnosis not present

## 2018-02-10 DIAGNOSIS — Z6841 Body Mass Index (BMI) 40.0 and over, adult: Secondary | ICD-10-CM | POA: Diagnosis not present

## 2018-02-10 DIAGNOSIS — F411 Generalized anxiety disorder: Secondary | ICD-10-CM | POA: Diagnosis not present

## 2018-02-10 DIAGNOSIS — R7303 Prediabetes: Secondary | ICD-10-CM | POA: Diagnosis not present

## 2018-02-10 DIAGNOSIS — Z1389 Encounter for screening for other disorder: Secondary | ICD-10-CM | POA: Diagnosis not present

## 2018-02-20 DIAGNOSIS — G4733 Obstructive sleep apnea (adult) (pediatric): Secondary | ICD-10-CM | POA: Diagnosis not present

## 2018-03-23 DIAGNOSIS — G4733 Obstructive sleep apnea (adult) (pediatric): Secondary | ICD-10-CM | POA: Diagnosis not present

## 2018-03-28 DIAGNOSIS — G4733 Obstructive sleep apnea (adult) (pediatric): Secondary | ICD-10-CM | POA: Diagnosis not present

## 2018-03-28 DIAGNOSIS — Z9989 Dependence on other enabling machines and devices: Secondary | ICD-10-CM | POA: Diagnosis not present

## 2018-04-14 DIAGNOSIS — G4733 Obstructive sleep apnea (adult) (pediatric): Secondary | ICD-10-CM | POA: Diagnosis not present

## 2018-04-23 DIAGNOSIS — G4733 Obstructive sleep apnea (adult) (pediatric): Secondary | ICD-10-CM | POA: Diagnosis not present

## 2018-05-01 DIAGNOSIS — G4733 Obstructive sleep apnea (adult) (pediatric): Secondary | ICD-10-CM | POA: Diagnosis not present

## 2018-05-16 DIAGNOSIS — G4733 Obstructive sleep apnea (adult) (pediatric): Secondary | ICD-10-CM | POA: Diagnosis not present

## 2018-05-21 DIAGNOSIS — N3946 Mixed incontinence: Secondary | ICD-10-CM | POA: Diagnosis not present

## 2018-05-21 DIAGNOSIS — R350 Frequency of micturition: Secondary | ICD-10-CM | POA: Diagnosis not present

## 2018-05-23 DIAGNOSIS — L820 Inflamed seborrheic keratosis: Secondary | ICD-10-CM | POA: Diagnosis not present

## 2018-05-23 DIAGNOSIS — L910 Hypertrophic scar: Secondary | ICD-10-CM | POA: Diagnosis not present

## 2018-05-23 DIAGNOSIS — Z85828 Personal history of other malignant neoplasm of skin: Secondary | ICD-10-CM | POA: Diagnosis not present

## 2018-05-23 DIAGNOSIS — L821 Other seborrheic keratosis: Secondary | ICD-10-CM | POA: Diagnosis not present

## 2018-05-24 DIAGNOSIS — G4733 Obstructive sleep apnea (adult) (pediatric): Secondary | ICD-10-CM | POA: Diagnosis not present

## 2018-06-24 DIAGNOSIS — G4733 Obstructive sleep apnea (adult) (pediatric): Secondary | ICD-10-CM | POA: Diagnosis not present

## 2018-07-16 DIAGNOSIS — M25512 Pain in left shoulder: Secondary | ICD-10-CM | POA: Diagnosis not present

## 2018-07-16 DIAGNOSIS — M542 Cervicalgia: Secondary | ICD-10-CM | POA: Diagnosis not present

## 2018-07-24 DIAGNOSIS — G4733 Obstructive sleep apnea (adult) (pediatric): Secondary | ICD-10-CM | POA: Diagnosis not present

## 2018-08-11 DIAGNOSIS — N3281 Overactive bladder: Secondary | ICD-10-CM | POA: Diagnosis not present

## 2018-08-11 DIAGNOSIS — F411 Generalized anxiety disorder: Secondary | ICD-10-CM | POA: Diagnosis not present

## 2018-08-11 DIAGNOSIS — M25512 Pain in left shoulder: Secondary | ICD-10-CM | POA: Diagnosis not present

## 2018-08-11 DIAGNOSIS — Z0000 Encounter for general adult medical examination without abnormal findings: Secondary | ICD-10-CM | POA: Diagnosis not present

## 2018-08-11 DIAGNOSIS — Z1389 Encounter for screening for other disorder: Secondary | ICD-10-CM | POA: Diagnosis not present

## 2018-08-11 DIAGNOSIS — I10 Essential (primary) hypertension: Secondary | ICD-10-CM | POA: Diagnosis not present

## 2018-08-11 DIAGNOSIS — G4733 Obstructive sleep apnea (adult) (pediatric): Secondary | ICD-10-CM | POA: Diagnosis not present

## 2018-08-11 DIAGNOSIS — Z23 Encounter for immunization: Secondary | ICD-10-CM | POA: Diagnosis not present

## 2018-08-11 DIAGNOSIS — K219 Gastro-esophageal reflux disease without esophagitis: Secondary | ICD-10-CM | POA: Diagnosis not present

## 2018-08-24 DIAGNOSIS — G4733 Obstructive sleep apnea (adult) (pediatric): Secondary | ICD-10-CM | POA: Diagnosis not present

## 2018-09-23 DIAGNOSIS — G4733 Obstructive sleep apnea (adult) (pediatric): Secondary | ICD-10-CM | POA: Diagnosis not present

## 2018-10-24 DIAGNOSIS — G4733 Obstructive sleep apnea (adult) (pediatric): Secondary | ICD-10-CM | POA: Diagnosis not present

## 2018-10-27 DIAGNOSIS — L821 Other seborrheic keratosis: Secondary | ICD-10-CM | POA: Diagnosis not present

## 2018-10-27 DIAGNOSIS — L820 Inflamed seborrheic keratosis: Secondary | ICD-10-CM | POA: Diagnosis not present

## 2018-10-27 DIAGNOSIS — L308 Other specified dermatitis: Secondary | ICD-10-CM | POA: Diagnosis not present

## 2018-10-27 DIAGNOSIS — L812 Freckles: Secondary | ICD-10-CM | POA: Diagnosis not present

## 2018-10-27 DIAGNOSIS — Z85828 Personal history of other malignant neoplasm of skin: Secondary | ICD-10-CM | POA: Diagnosis not present

## 2018-10-27 DIAGNOSIS — L570 Actinic keratosis: Secondary | ICD-10-CM | POA: Diagnosis not present

## 2018-11-06 DIAGNOSIS — G4733 Obstructive sleep apnea (adult) (pediatric): Secondary | ICD-10-CM | POA: Diagnosis not present

## 2018-11-24 DIAGNOSIS — G4733 Obstructive sleep apnea (adult) (pediatric): Secondary | ICD-10-CM | POA: Diagnosis not present

## 2018-12-17 DIAGNOSIS — Z803 Family history of malignant neoplasm of breast: Secondary | ICD-10-CM | POA: Diagnosis not present

## 2018-12-17 DIAGNOSIS — Z1231 Encounter for screening mammogram for malignant neoplasm of breast: Secondary | ICD-10-CM | POA: Diagnosis not present

## 2018-12-23 DIAGNOSIS — G4733 Obstructive sleep apnea (adult) (pediatric): Secondary | ICD-10-CM | POA: Diagnosis not present

## 2019-01-23 DIAGNOSIS — J301 Allergic rhinitis due to pollen: Secondary | ICD-10-CM | POA: Diagnosis not present

## 2019-01-23 DIAGNOSIS — N390 Urinary tract infection, site not specified: Secondary | ICD-10-CM | POA: Diagnosis not present

## 2019-01-23 DIAGNOSIS — G4733 Obstructive sleep apnea (adult) (pediatric): Secondary | ICD-10-CM | POA: Diagnosis not present

## 2019-02-09 DIAGNOSIS — G4733 Obstructive sleep apnea (adult) (pediatric): Secondary | ICD-10-CM | POA: Diagnosis not present

## 2019-02-09 DIAGNOSIS — H109 Unspecified conjunctivitis: Secondary | ICD-10-CM | POA: Diagnosis not present

## 2019-02-16 DIAGNOSIS — R7303 Prediabetes: Secondary | ICD-10-CM | POA: Diagnosis not present

## 2019-02-16 DIAGNOSIS — F411 Generalized anxiety disorder: Secondary | ICD-10-CM | POA: Diagnosis not present

## 2019-02-16 DIAGNOSIS — I10 Essential (primary) hypertension: Secondary | ICD-10-CM | POA: Diagnosis not present

## 2019-02-22 DIAGNOSIS — G4733 Obstructive sleep apnea (adult) (pediatric): Secondary | ICD-10-CM | POA: Diagnosis not present

## 2019-03-18 DIAGNOSIS — M25562 Pain in left knee: Secondary | ICD-10-CM | POA: Diagnosis not present

## 2019-03-18 DIAGNOSIS — S8001XA Contusion of right knee, initial encounter: Secondary | ICD-10-CM | POA: Diagnosis not present

## 2019-03-25 DIAGNOSIS — G4733 Obstructive sleep apnea (adult) (pediatric): Secondary | ICD-10-CM | POA: Diagnosis not present

## 2019-04-13 DIAGNOSIS — G4733 Obstructive sleep apnea (adult) (pediatric): Secondary | ICD-10-CM | POA: Diagnosis not present

## 2019-04-15 DIAGNOSIS — M7661 Achilles tendinitis, right leg: Secondary | ICD-10-CM | POA: Diagnosis not present

## 2019-04-24 DIAGNOSIS — G4733 Obstructive sleep apnea (adult) (pediatric): Secondary | ICD-10-CM | POA: Diagnosis not present

## 2019-04-27 DIAGNOSIS — M7661 Achilles tendinitis, right leg: Secondary | ICD-10-CM | POA: Diagnosis not present

## 2019-04-30 DIAGNOSIS — M7661 Achilles tendinitis, right leg: Secondary | ICD-10-CM | POA: Diagnosis not present

## 2019-05-04 DIAGNOSIS — M7661 Achilles tendinitis, right leg: Secondary | ICD-10-CM | POA: Diagnosis not present

## 2019-05-07 DIAGNOSIS — M7661 Achilles tendinitis, right leg: Secondary | ICD-10-CM | POA: Diagnosis not present

## 2019-05-11 DIAGNOSIS — M7661 Achilles tendinitis, right leg: Secondary | ICD-10-CM | POA: Diagnosis not present

## 2019-05-12 DIAGNOSIS — G4733 Obstructive sleep apnea (adult) (pediatric): Secondary | ICD-10-CM | POA: Diagnosis not present

## 2019-05-14 DIAGNOSIS — M7661 Achilles tendinitis, right leg: Secondary | ICD-10-CM | POA: Diagnosis not present

## 2019-05-21 DIAGNOSIS — M7661 Achilles tendinitis, right leg: Secondary | ICD-10-CM | POA: Diagnosis not present

## 2019-05-22 DIAGNOSIS — M7661 Achilles tendinitis, right leg: Secondary | ICD-10-CM | POA: Diagnosis not present

## 2019-05-28 DIAGNOSIS — M7661 Achilles tendinitis, right leg: Secondary | ICD-10-CM | POA: Diagnosis not present

## 2019-06-16 DIAGNOSIS — G4733 Obstructive sleep apnea (adult) (pediatric): Secondary | ICD-10-CM | POA: Diagnosis not present

## 2019-06-26 DIAGNOSIS — M958 Other specified acquired deformities of musculoskeletal system: Secondary | ICD-10-CM | POA: Diagnosis not present

## 2019-06-26 DIAGNOSIS — M7661 Achilles tendinitis, right leg: Secondary | ICD-10-CM | POA: Diagnosis not present

## 2019-06-26 DIAGNOSIS — S86011A Strain of right Achilles tendon, initial encounter: Secondary | ICD-10-CM | POA: Diagnosis not present

## 2019-06-30 DIAGNOSIS — L821 Other seborrheic keratosis: Secondary | ICD-10-CM | POA: Diagnosis not present

## 2019-06-30 DIAGNOSIS — Z85828 Personal history of other malignant neoplasm of skin: Secondary | ICD-10-CM | POA: Diagnosis not present

## 2019-06-30 DIAGNOSIS — L814 Other melanin hyperpigmentation: Secondary | ICD-10-CM | POA: Diagnosis not present

## 2019-06-30 DIAGNOSIS — L820 Inflamed seborrheic keratosis: Secondary | ICD-10-CM | POA: Diagnosis not present

## 2019-08-07 DIAGNOSIS — Z111 Encounter for screening for respiratory tuberculosis: Secondary | ICD-10-CM | POA: Diagnosis not present

## 2019-08-07 DIAGNOSIS — Z7189 Other specified counseling: Secondary | ICD-10-CM | POA: Diagnosis not present

## 2019-08-07 DIAGNOSIS — Z23 Encounter for immunization: Secondary | ICD-10-CM | POA: Diagnosis not present

## 2019-08-07 DIAGNOSIS — L309 Dermatitis, unspecified: Secondary | ICD-10-CM | POA: Diagnosis not present

## 2019-08-07 DIAGNOSIS — R233 Spontaneous ecchymoses: Secondary | ICD-10-CM | POA: Diagnosis not present

## 2019-08-19 DIAGNOSIS — I10 Essential (primary) hypertension: Secondary | ICD-10-CM | POA: Diagnosis not present

## 2019-08-19 DIAGNOSIS — F411 Generalized anxiety disorder: Secondary | ICD-10-CM | POA: Diagnosis not present

## 2019-08-19 DIAGNOSIS — K219 Gastro-esophageal reflux disease without esophagitis: Secondary | ICD-10-CM | POA: Diagnosis not present

## 2019-08-19 DIAGNOSIS — Z1389 Encounter for screening for other disorder: Secondary | ICD-10-CM | POA: Diagnosis not present

## 2019-08-19 DIAGNOSIS — N3281 Overactive bladder: Secondary | ICD-10-CM | POA: Diagnosis not present

## 2019-08-19 DIAGNOSIS — Z0000 Encounter for general adult medical examination without abnormal findings: Secondary | ICD-10-CM | POA: Diagnosis not present

## 2019-08-19 DIAGNOSIS — G4733 Obstructive sleep apnea (adult) (pediatric): Secondary | ICD-10-CM | POA: Diagnosis not present

## 2019-09-23 DIAGNOSIS — Z23 Encounter for immunization: Secondary | ICD-10-CM | POA: Diagnosis not present

## 2019-09-25 DIAGNOSIS — M67871 Other specified disorders of synovium, right ankle and foot: Secondary | ICD-10-CM | POA: Diagnosis not present

## 2019-09-28 DIAGNOSIS — G4733 Obstructive sleep apnea (adult) (pediatric): Secondary | ICD-10-CM | POA: Diagnosis not present

## 2019-10-06 DIAGNOSIS — M67879 Other specified disorders of synovium and tendon, unspecified ankle and foot: Secondary | ICD-10-CM | POA: Diagnosis not present

## 2019-10-13 DIAGNOSIS — M67879 Other specified disorders of synovium and tendon, unspecified ankle and foot: Secondary | ICD-10-CM | POA: Diagnosis not present

## 2019-10-19 DIAGNOSIS — M67879 Other specified disorders of synovium and tendon, unspecified ankle and foot: Secondary | ICD-10-CM | POA: Diagnosis not present

## 2019-10-21 DIAGNOSIS — M67879 Other specified disorders of synovium and tendon, unspecified ankle and foot: Secondary | ICD-10-CM | POA: Diagnosis not present

## 2019-10-27 DIAGNOSIS — M67879 Other specified disorders of synovium and tendon, unspecified ankle and foot: Secondary | ICD-10-CM | POA: Diagnosis not present

## 2019-10-29 DIAGNOSIS — Z85828 Personal history of other malignant neoplasm of skin: Secondary | ICD-10-CM | POA: Diagnosis not present

## 2019-10-29 DIAGNOSIS — L404 Guttate psoriasis: Secondary | ICD-10-CM | POA: Diagnosis not present

## 2019-10-29 DIAGNOSIS — M67879 Other specified disorders of synovium and tendon, unspecified ankle and foot: Secondary | ICD-10-CM | POA: Diagnosis not present

## 2019-11-03 DIAGNOSIS — M67879 Other specified disorders of synovium and tendon, unspecified ankle and foot: Secondary | ICD-10-CM | POA: Diagnosis not present

## 2019-11-05 DIAGNOSIS — M67879 Other specified disorders of synovium and tendon, unspecified ankle and foot: Secondary | ICD-10-CM | POA: Diagnosis not present

## 2019-11-09 DIAGNOSIS — M25571 Pain in right ankle and joints of right foot: Secondary | ICD-10-CM | POA: Diagnosis not present

## 2019-11-09 DIAGNOSIS — M67871 Other specified disorders of synovium, right ankle and foot: Secondary | ICD-10-CM | POA: Diagnosis not present

## 2019-11-18 DIAGNOSIS — M67879 Other specified disorders of synovium and tendon, unspecified ankle and foot: Secondary | ICD-10-CM | POA: Diagnosis not present

## 2019-11-18 DIAGNOSIS — M67871 Other specified disorders of synovium, right ankle and foot: Secondary | ICD-10-CM | POA: Diagnosis not present

## 2019-11-23 DIAGNOSIS — M67871 Other specified disorders of synovium, right ankle and foot: Secondary | ICD-10-CM | POA: Diagnosis not present

## 2019-11-23 DIAGNOSIS — M6701 Short Achilles tendon (acquired), right ankle: Secondary | ICD-10-CM | POA: Diagnosis not present

## 2019-11-23 DIAGNOSIS — M898X7 Other specified disorders of bone, ankle and foot: Secondary | ICD-10-CM | POA: Diagnosis not present

## 2019-11-23 DIAGNOSIS — M7661 Achilles tendinitis, right leg: Secondary | ICD-10-CM | POA: Diagnosis not present

## 2019-11-30 DIAGNOSIS — F411 Generalized anxiety disorder: Secondary | ICD-10-CM | POA: Diagnosis not present

## 2019-11-30 DIAGNOSIS — I10 Essential (primary) hypertension: Secondary | ICD-10-CM | POA: Diagnosis not present

## 2019-11-30 DIAGNOSIS — K219 Gastro-esophageal reflux disease without esophagitis: Secondary | ICD-10-CM | POA: Diagnosis not present

## 2019-12-15 DIAGNOSIS — Z23 Encounter for immunization: Secondary | ICD-10-CM | POA: Diagnosis not present

## 2019-12-23 DIAGNOSIS — Z1231 Encounter for screening mammogram for malignant neoplasm of breast: Secondary | ICD-10-CM | POA: Diagnosis not present

## 2019-12-29 DIAGNOSIS — L812 Freckles: Secondary | ICD-10-CM | POA: Diagnosis not present

## 2019-12-29 DIAGNOSIS — L821 Other seborrheic keratosis: Secondary | ICD-10-CM | POA: Diagnosis not present

## 2019-12-29 DIAGNOSIS — L400 Psoriasis vulgaris: Secondary | ICD-10-CM | POA: Diagnosis not present

## 2019-12-29 DIAGNOSIS — D1801 Hemangioma of skin and subcutaneous tissue: Secondary | ICD-10-CM | POA: Diagnosis not present

## 2019-12-29 DIAGNOSIS — Z85828 Personal history of other malignant neoplasm of skin: Secondary | ICD-10-CM | POA: Diagnosis not present

## 2019-12-29 DIAGNOSIS — L570 Actinic keratosis: Secondary | ICD-10-CM | POA: Diagnosis not present

## 2020-02-17 DIAGNOSIS — F411 Generalized anxiety disorder: Secondary | ICD-10-CM | POA: Diagnosis not present

## 2020-02-17 DIAGNOSIS — G4733 Obstructive sleep apnea (adult) (pediatric): Secondary | ICD-10-CM | POA: Diagnosis not present

## 2020-02-17 DIAGNOSIS — R7309 Other abnormal glucose: Secondary | ICD-10-CM | POA: Diagnosis not present

## 2020-02-17 DIAGNOSIS — Z136 Encounter for screening for cardiovascular disorders: Secondary | ICD-10-CM | POA: Diagnosis not present

## 2020-02-17 DIAGNOSIS — M1990 Unspecified osteoarthritis, unspecified site: Secondary | ICD-10-CM | POA: Diagnosis not present

## 2020-02-17 DIAGNOSIS — I10 Essential (primary) hypertension: Secondary | ICD-10-CM | POA: Diagnosis not present

## 2020-04-13 DIAGNOSIS — G4733 Obstructive sleep apnea (adult) (pediatric): Secondary | ICD-10-CM | POA: Diagnosis not present

## 2020-04-21 DIAGNOSIS — M67961 Unspecified disorder of synovium and tendon, right lower leg: Secondary | ICD-10-CM | POA: Diagnosis not present

## 2020-05-09 DIAGNOSIS — M7661 Achilles tendinitis, right leg: Secondary | ICD-10-CM | POA: Diagnosis not present

## 2020-05-10 DIAGNOSIS — M25774 Osteophyte, right foot: Secondary | ICD-10-CM | POA: Diagnosis not present

## 2020-05-10 DIAGNOSIS — M6701 Short Achilles tendon (acquired), right ankle: Secondary | ICD-10-CM | POA: Diagnosis not present

## 2020-05-10 DIAGNOSIS — M7661 Achilles tendinitis, right leg: Secondary | ICD-10-CM | POA: Diagnosis not present

## 2020-05-10 DIAGNOSIS — G8918 Other acute postprocedural pain: Secondary | ICD-10-CM | POA: Diagnosis not present

## 2020-05-10 DIAGNOSIS — M25775 Osteophyte, left foot: Secondary | ICD-10-CM | POA: Diagnosis not present

## 2020-05-25 DIAGNOSIS — M67871 Other specified disorders of synovium, right ankle and foot: Secondary | ICD-10-CM | POA: Diagnosis not present

## 2020-06-23 DIAGNOSIS — D485 Neoplasm of uncertain behavior of skin: Secondary | ICD-10-CM | POA: Diagnosis not present

## 2020-06-23 DIAGNOSIS — L820 Inflamed seborrheic keratosis: Secondary | ICD-10-CM | POA: Diagnosis not present

## 2020-06-23 DIAGNOSIS — Z85828 Personal history of other malignant neoplasm of skin: Secondary | ICD-10-CM | POA: Diagnosis not present

## 2020-06-28 DIAGNOSIS — M79671 Pain in right foot: Secondary | ICD-10-CM | POA: Diagnosis not present

## 2020-06-30 DIAGNOSIS — H25013 Cortical age-related cataract, bilateral: Secondary | ICD-10-CM | POA: Diagnosis not present

## 2020-06-30 DIAGNOSIS — H524 Presbyopia: Secondary | ICD-10-CM | POA: Diagnosis not present

## 2020-06-30 DIAGNOSIS — H5213 Myopia, bilateral: Secondary | ICD-10-CM | POA: Diagnosis not present

## 2020-06-30 DIAGNOSIS — H2513 Age-related nuclear cataract, bilateral: Secondary | ICD-10-CM | POA: Diagnosis not present

## 2020-07-01 DIAGNOSIS — G4733 Obstructive sleep apnea (adult) (pediatric): Secondary | ICD-10-CM | POA: Diagnosis not present

## 2020-07-06 DIAGNOSIS — M79671 Pain in right foot: Secondary | ICD-10-CM | POA: Diagnosis not present

## 2020-07-08 DIAGNOSIS — M79671 Pain in right foot: Secondary | ICD-10-CM | POA: Diagnosis not present

## 2020-07-11 DIAGNOSIS — M79671 Pain in right foot: Secondary | ICD-10-CM | POA: Diagnosis not present

## 2020-07-13 DIAGNOSIS — Z9841 Cataract extraction status, right eye: Secondary | ICD-10-CM | POA: Diagnosis not present

## 2020-07-13 DIAGNOSIS — H25812 Combined forms of age-related cataract, left eye: Secondary | ICD-10-CM | POA: Diagnosis not present

## 2020-07-13 DIAGNOSIS — Z48810 Encounter for surgical aftercare following surgery on the sense organs: Secondary | ICD-10-CM | POA: Diagnosis not present

## 2020-07-13 DIAGNOSIS — Z961 Presence of intraocular lens: Secondary | ICD-10-CM | POA: Diagnosis not present

## 2020-07-13 DIAGNOSIS — H25813 Combined forms of age-related cataract, bilateral: Secondary | ICD-10-CM | POA: Diagnosis not present

## 2020-07-18 DIAGNOSIS — M79671 Pain in right foot: Secondary | ICD-10-CM | POA: Diagnosis not present

## 2020-07-21 DIAGNOSIS — M79671 Pain in right foot: Secondary | ICD-10-CM | POA: Diagnosis not present

## 2020-07-22 ENCOUNTER — Ambulatory Visit: Payer: BLUE CROSS/BLUE SHIELD | Attending: Internal Medicine

## 2020-07-22 ENCOUNTER — Other Ambulatory Visit (HOSPITAL_BASED_OUTPATIENT_CLINIC_OR_DEPARTMENT_OTHER): Payer: Self-pay | Admitting: Internal Medicine

## 2020-07-22 DIAGNOSIS — Z23 Encounter for immunization: Secondary | ICD-10-CM

## 2020-08-17 DIAGNOSIS — H2511 Age-related nuclear cataract, right eye: Secondary | ICD-10-CM | POA: Diagnosis not present

## 2020-08-17 DIAGNOSIS — H25011 Cortical age-related cataract, right eye: Secondary | ICD-10-CM | POA: Diagnosis not present

## 2020-08-22 DIAGNOSIS — K219 Gastro-esophageal reflux disease without esophagitis: Secondary | ICD-10-CM | POA: Diagnosis not present

## 2020-08-22 DIAGNOSIS — R7309 Other abnormal glucose: Secondary | ICD-10-CM | POA: Diagnosis not present

## 2020-08-22 DIAGNOSIS — N3281 Overactive bladder: Secondary | ICD-10-CM | POA: Diagnosis not present

## 2020-08-22 DIAGNOSIS — I10 Essential (primary) hypertension: Secondary | ICD-10-CM | POA: Diagnosis not present

## 2020-08-22 DIAGNOSIS — G4733 Obstructive sleep apnea (adult) (pediatric): Secondary | ICD-10-CM | POA: Diagnosis not present

## 2020-08-22 DIAGNOSIS — F411 Generalized anxiety disorder: Secondary | ICD-10-CM | POA: Diagnosis not present

## 2020-08-22 DIAGNOSIS — Z23 Encounter for immunization: Secondary | ICD-10-CM | POA: Diagnosis not present

## 2020-08-22 DIAGNOSIS — Z0000 Encounter for general adult medical examination without abnormal findings: Secondary | ICD-10-CM | POA: Diagnosis not present

## 2020-08-24 DIAGNOSIS — H2512 Age-related nuclear cataract, left eye: Secondary | ICD-10-CM | POA: Diagnosis not present

## 2020-10-03 DIAGNOSIS — M79602 Pain in left arm: Secondary | ICD-10-CM | POA: Diagnosis not present

## 2020-10-10 DIAGNOSIS — M25512 Pain in left shoulder: Secondary | ICD-10-CM | POA: Diagnosis not present

## 2020-10-10 DIAGNOSIS — M542 Cervicalgia: Secondary | ICD-10-CM | POA: Diagnosis not present

## 2020-10-11 DIAGNOSIS — N3946 Mixed incontinence: Secondary | ICD-10-CM | POA: Diagnosis not present

## 2020-10-11 DIAGNOSIS — R350 Frequency of micturition: Secondary | ICD-10-CM | POA: Diagnosis not present

## 2020-10-13 DIAGNOSIS — M5010 Cervical disc disorder with radiculopathy, unspecified cervical region: Secondary | ICD-10-CM | POA: Diagnosis not present

## 2020-10-18 DIAGNOSIS — M5010 Cervical disc disorder with radiculopathy, unspecified cervical region: Secondary | ICD-10-CM | POA: Diagnosis not present

## 2020-10-27 ENCOUNTER — Other Ambulatory Visit: Payer: Self-pay | Admitting: Student

## 2020-10-27 ENCOUNTER — Other Ambulatory Visit: Payer: Self-pay | Admitting: Orthopedic Surgery

## 2020-10-27 DIAGNOSIS — M542 Cervicalgia: Secondary | ICD-10-CM

## 2020-10-27 DIAGNOSIS — Z77018 Contact with and (suspected) exposure to other hazardous metals: Secondary | ICD-10-CM

## 2020-11-17 ENCOUNTER — Ambulatory Visit
Admission: RE | Admit: 2020-11-17 | Discharge: 2020-11-17 | Disposition: A | Discharge status: Home / Self Care | Payer: BLUE CROSS/BLUE SHIELD | Source: Ambulatory Visit | Attending: Orthopedic Surgery | Admitting: Orthopedic Surgery

## 2020-11-17 ENCOUNTER — Other Ambulatory Visit: Payer: Self-pay

## 2020-11-17 DIAGNOSIS — M4802 Spinal stenosis, cervical region: Secondary | ICD-10-CM | POA: Diagnosis not present

## 2020-11-17 DIAGNOSIS — M542 Cervicalgia: Secondary | ICD-10-CM

## 2020-11-17 DIAGNOSIS — Z01818 Encounter for other preprocedural examination: Secondary | ICD-10-CM | POA: Diagnosis not present

## 2020-11-17 DIAGNOSIS — Z77018 Contact with and (suspected) exposure to other hazardous metals: Secondary | ICD-10-CM

## 2020-11-21 DIAGNOSIS — M5412 Radiculopathy, cervical region: Secondary | ICD-10-CM | POA: Diagnosis not present

## 2020-11-21 DIAGNOSIS — M542 Cervicalgia: Secondary | ICD-10-CM | POA: Diagnosis not present

## 2020-11-21 DIAGNOSIS — M5010 Cervical disc disorder with radiculopathy, unspecified cervical region: Secondary | ICD-10-CM | POA: Diagnosis not present

## 2020-11-24 DIAGNOSIS — G4733 Obstructive sleep apnea (adult) (pediatric): Secondary | ICD-10-CM | POA: Diagnosis not present

## 2020-12-01 DIAGNOSIS — M5412 Radiculopathy, cervical region: Secondary | ICD-10-CM | POA: Diagnosis not present

## 2020-12-19 DIAGNOSIS — M5412 Radiculopathy, cervical region: Secondary | ICD-10-CM | POA: Diagnosis not present

## 2020-12-19 DIAGNOSIS — M5010 Cervical disc disorder with radiculopathy, unspecified cervical region: Secondary | ICD-10-CM | POA: Diagnosis not present

## 2020-12-28 DIAGNOSIS — Z1231 Encounter for screening mammogram for malignant neoplasm of breast: Secondary | ICD-10-CM | POA: Diagnosis not present

## 2021-01-02 DIAGNOSIS — L570 Actinic keratosis: Secondary | ICD-10-CM | POA: Diagnosis not present

## 2021-01-02 DIAGNOSIS — L400 Psoriasis vulgaris: Secondary | ICD-10-CM | POA: Diagnosis not present

## 2021-01-02 DIAGNOSIS — D1801 Hemangioma of skin and subcutaneous tissue: Secondary | ICD-10-CM | POA: Diagnosis not present

## 2021-01-02 DIAGNOSIS — Z85828 Personal history of other malignant neoplasm of skin: Secondary | ICD-10-CM | POA: Diagnosis not present

## 2021-01-02 DIAGNOSIS — L820 Inflamed seborrheic keratosis: Secondary | ICD-10-CM | POA: Diagnosis not present

## 2021-01-02 DIAGNOSIS — L821 Other seborrheic keratosis: Secondary | ICD-10-CM | POA: Diagnosis not present

## 2021-01-02 DIAGNOSIS — L812 Freckles: Secondary | ICD-10-CM | POA: Diagnosis not present

## 2021-01-02 DIAGNOSIS — Z961 Presence of intraocular lens: Secondary | ICD-10-CM | POA: Diagnosis not present

## 2021-01-26 ENCOUNTER — Ambulatory Visit: Payer: PPO | Attending: Internal Medicine

## 2021-01-26 DIAGNOSIS — Z23 Encounter for immunization: Secondary | ICD-10-CM

## 2021-01-26 NOTE — Progress Notes (Signed)
   Covid-19 Vaccination Clinic  Name:  FLORAINE BUECHLER    MRN: [id number] DOB: 1951-03-25  01/26/2021  Ms. Nowak was observed post Covid-19 immunization for 15 minutes without incident. She was provided with Vaccine Information Sheet and instruction to access the V-Safe system.   Ms. Schelling was instructed to call 911 with any severe reactions post vaccine: Marland Kitchen Difficulty breathing  . Swelling of face and throat  . A fast heartbeat  . A bad rash all over body  . Dizziness and weakness   Immunizations Administered    Name Date Dose VIS Date Route   PFIZER Comrnaty(Gray TOP) Covid-19 Vaccine 01/26/2021  2:28 PM 0.3 mL 09/29/2020 Intramuscular   Manufacturer: [employer]   Lot: [id number]   [place]: [phone]

## 2021-02-02 ENCOUNTER — Other Ambulatory Visit (HOSPITAL_BASED_OUTPATIENT_CLINIC_OR_DEPARTMENT_OTHER): Payer: Self-pay

## 2021-02-02 DIAGNOSIS — M5412 Radiculopathy, cervical region: Secondary | ICD-10-CM | POA: Diagnosis not present

## 2021-02-02 MED ORDER — PFIZER-BIONT COVID-19 VAC-TRIS 30 MCG/0.3ML IM SUSP
INTRAMUSCULAR | 0 refills | Status: DC
  Filled 2021-02-02: qty 0.3, 1d supply, fill #0

## 2021-02-03 ENCOUNTER — Other Ambulatory Visit (HOSPITAL_BASED_OUTPATIENT_CLINIC_OR_DEPARTMENT_OTHER): Payer: Self-pay

## 2021-02-20 DIAGNOSIS — I10 Essential (primary) hypertension: Secondary | ICD-10-CM | POA: Diagnosis not present

## 2021-02-20 DIAGNOSIS — M542 Cervicalgia: Secondary | ICD-10-CM | POA: Diagnosis not present

## 2021-02-20 DIAGNOSIS — R7303 Prediabetes: Secondary | ICD-10-CM | POA: Diagnosis not present

## 2021-02-20 DIAGNOSIS — R600 Localized edema: Secondary | ICD-10-CM | POA: Diagnosis not present

## 2021-02-20 DIAGNOSIS — M5412 Radiculopathy, cervical region: Secondary | ICD-10-CM | POA: Diagnosis not present

## 2021-02-20 DIAGNOSIS — M5010 Cervical disc disorder with radiculopathy, unspecified cervical region: Secondary | ICD-10-CM | POA: Diagnosis not present

## 2021-02-20 DIAGNOSIS — F411 Generalized anxiety disorder: Secondary | ICD-10-CM | POA: Diagnosis not present

## 2021-02-20 DIAGNOSIS — M545 Low back pain, unspecified: Secondary | ICD-10-CM | POA: Diagnosis not present

## 2021-03-01 DIAGNOSIS — G4733 Obstructive sleep apnea (adult) (pediatric): Secondary | ICD-10-CM | POA: Diagnosis not present

## 2021-04-26 DIAGNOSIS — G4733 Obstructive sleep apnea (adult) (pediatric): Secondary | ICD-10-CM | POA: Diagnosis not present

## 2021-05-29 DIAGNOSIS — G4733 Obstructive sleep apnea (adult) (pediatric): Secondary | ICD-10-CM | POA: Diagnosis not present

## 2021-06-08 DIAGNOSIS — S060X0A Concussion without loss of consciousness, initial encounter: Secondary | ICD-10-CM | POA: Diagnosis not present

## 2021-06-08 DIAGNOSIS — S0033XA Contusion of nose, initial encounter: Secondary | ICD-10-CM | POA: Diagnosis not present

## 2021-06-08 DIAGNOSIS — S62646A Nondisplaced fracture of proximal phalanx of right little finger, initial encounter for closed fracture: Secondary | ICD-10-CM | POA: Diagnosis not present

## 2021-06-08 DIAGNOSIS — S0120XA Unspecified open wound of nose, initial encounter: Secondary | ICD-10-CM | POA: Diagnosis not present

## 2021-06-16 DIAGNOSIS — S62616D Displaced fracture of proximal phalanx of right little finger, subsequent encounter for fracture with routine healing: Secondary | ICD-10-CM | POA: Diagnosis not present

## 2021-06-17 DIAGNOSIS — S62616D Displaced fracture of proximal phalanx of right little finger, subsequent encounter for fracture with routine healing: Secondary | ICD-10-CM | POA: Diagnosis not present

## 2021-06-17 DIAGNOSIS — S61401D Unspecified open wound of right hand, subsequent encounter: Secondary | ICD-10-CM | POA: Diagnosis not present

## 2021-06-20 DIAGNOSIS — S61401D Unspecified open wound of right hand, subsequent encounter: Secondary | ICD-10-CM | POA: Diagnosis not present

## 2021-07-05 DIAGNOSIS — G4733 Obstructive sleep apnea (adult) (pediatric): Secondary | ICD-10-CM | POA: Diagnosis not present

## 2021-07-05 DIAGNOSIS — S62616D Displaced fracture of proximal phalanx of right little finger, subsequent encounter for fracture with routine healing: Secondary | ICD-10-CM | POA: Diagnosis not present

## 2021-07-06 DIAGNOSIS — L820 Inflamed seborrheic keratosis: Secondary | ICD-10-CM | POA: Diagnosis not present

## 2021-07-06 DIAGNOSIS — Z85828 Personal history of other malignant neoplasm of skin: Secondary | ICD-10-CM | POA: Diagnosis not present

## 2021-07-14 DIAGNOSIS — G4733 Obstructive sleep apnea (adult) (pediatric): Secondary | ICD-10-CM | POA: Diagnosis not present

## 2021-07-18 DIAGNOSIS — N3000 Acute cystitis without hematuria: Secondary | ICD-10-CM | POA: Diagnosis not present

## 2021-07-19 DIAGNOSIS — S62617G Displaced fracture of proximal phalanx of left little finger, subsequent encounter for fracture with delayed healing: Secondary | ICD-10-CM | POA: Diagnosis not present

## 2021-09-01 DIAGNOSIS — S62646A Nondisplaced fracture of proximal phalanx of right little finger, initial encounter for closed fracture: Secondary | ICD-10-CM | POA: Diagnosis not present

## 2021-09-11 DIAGNOSIS — Z79899 Other long term (current) drug therapy: Secondary | ICD-10-CM | POA: Diagnosis not present

## 2021-09-11 DIAGNOSIS — I10 Essential (primary) hypertension: Secondary | ICD-10-CM | POA: Diagnosis not present

## 2021-09-11 DIAGNOSIS — Z0000 Encounter for general adult medical examination without abnormal findings: Secondary | ICD-10-CM | POA: Diagnosis not present

## 2021-09-11 DIAGNOSIS — Z1389 Encounter for screening for other disorder: Secondary | ICD-10-CM | POA: Diagnosis not present

## 2021-09-11 DIAGNOSIS — F411 Generalized anxiety disorder: Secondary | ICD-10-CM | POA: Diagnosis not present

## 2021-09-11 DIAGNOSIS — R059 Cough, unspecified: Secondary | ICD-10-CM | POA: Diagnosis not present

## 2021-09-11 DIAGNOSIS — G4733 Obstructive sleep apnea (adult) (pediatric): Secondary | ICD-10-CM | POA: Diagnosis not present

## 2021-09-11 DIAGNOSIS — N3281 Overactive bladder: Secondary | ICD-10-CM | POA: Diagnosis not present

## 2021-09-11 DIAGNOSIS — Z23 Encounter for immunization: Secondary | ICD-10-CM | POA: Diagnosis not present

## 2021-09-11 DIAGNOSIS — K219 Gastro-esophageal reflux disease without esophagitis: Secondary | ICD-10-CM | POA: Diagnosis not present

## 2021-09-12 DIAGNOSIS — M20091 Other deformity of right finger(s): Secondary | ICD-10-CM | POA: Diagnosis not present

## 2021-09-12 DIAGNOSIS — M25641 Stiffness of right hand, not elsewhere classified: Secondary | ICD-10-CM | POA: Diagnosis not present

## 2021-09-12 DIAGNOSIS — S62616S Displaced fracture of proximal phalanx of right little finger, sequela: Secondary | ICD-10-CM | POA: Diagnosis not present

## 2021-09-20 DIAGNOSIS — R29898 Other symptoms and signs involving the musculoskeletal system: Secondary | ICD-10-CM | POA: Diagnosis not present

## 2021-09-20 DIAGNOSIS — M25641 Stiffness of right hand, not elsewhere classified: Secondary | ICD-10-CM | POA: Diagnosis not present

## 2021-09-20 DIAGNOSIS — S62616S Displaced fracture of proximal phalanx of right little finger, sequela: Secondary | ICD-10-CM | POA: Diagnosis not present

## 2021-09-25 DIAGNOSIS — L820 Inflamed seborrheic keratosis: Secondary | ICD-10-CM | POA: Diagnosis not present

## 2021-09-25 DIAGNOSIS — L404 Guttate psoriasis: Secondary | ICD-10-CM | POA: Diagnosis not present

## 2021-09-25 DIAGNOSIS — Z85828 Personal history of other malignant neoplasm of skin: Secondary | ICD-10-CM | POA: Diagnosis not present

## 2021-09-26 DIAGNOSIS — Z789 Other specified health status: Secondary | ICD-10-CM | POA: Diagnosis not present

## 2021-09-26 DIAGNOSIS — S62616S Displaced fracture of proximal phalanx of right little finger, sequela: Secondary | ICD-10-CM | POA: Diagnosis not present

## 2021-09-26 DIAGNOSIS — R29898 Other symptoms and signs involving the musculoskeletal system: Secondary | ICD-10-CM | POA: Diagnosis not present

## 2021-09-26 DIAGNOSIS — M25641 Stiffness of right hand, not elsewhere classified: Secondary | ICD-10-CM | POA: Diagnosis not present

## 2021-10-04 ENCOUNTER — Ambulatory Visit: Payer: PPO | Attending: Internal Medicine

## 2021-10-04 ENCOUNTER — Other Ambulatory Visit (HOSPITAL_BASED_OUTPATIENT_CLINIC_OR_DEPARTMENT_OTHER): Payer: Self-pay

## 2021-10-04 DIAGNOSIS — Z23 Encounter for immunization: Secondary | ICD-10-CM

## 2021-10-04 MED ORDER — PFIZER COVID-19 VAC BIVALENT 30 MCG/0.3ML IM SUSP
INTRAMUSCULAR | 0 refills | Status: DC
  Filled 2021-10-04: qty 0.3, 1d supply, fill #0

## 2021-10-04 MED ORDER — BOOSTRIX 5-2.5-18.5 LF-MCG/0.5 IM SUSY
PREFILLED_SYRINGE | INTRAMUSCULAR | 0 refills | Status: DC
  Filled 2021-10-04: qty 0.5, 1d supply, fill #0

## 2021-10-04 NOTE — Progress Notes (Signed)
° °  Covid-19 Vaccination Clinic  Name:  Crystal Potter    MRN: [id number] DOB: 1951-08-14  10/04/2021  [name] was observed post Covid-19 immunization for 15 minutes without incident. She was provided with Vaccine Information Sheet and instruction to access the V-Safe system.   [name] was instructed to call 911 with any severe reactions post vaccine: Difficulty breathing  Swelling of face and throat  A fast heartbeat  A bad rash all over body  Dizziness and weakness   Immunizations Administered     Name Date Dose VIS Date Route   Pfizer Covid-19 Vaccine Bivalent Booster 10/04/2021  1:29 PM 0.3 mL 06/21/2021 Intramuscular   Manufacturer: [place]   Lot: [id number]   [place]: [place], PharmD, MBA Clinical Acute Care Pharmacist

## 2021-10-05 DIAGNOSIS — Z780 Asymptomatic menopausal state: Secondary | ICD-10-CM | POA: Diagnosis not present

## 2021-10-11 DIAGNOSIS — R29898 Other symptoms and signs involving the musculoskeletal system: Secondary | ICD-10-CM | POA: Diagnosis not present

## 2021-10-11 DIAGNOSIS — Z789 Other specified health status: Secondary | ICD-10-CM | POA: Diagnosis not present

## 2021-10-11 DIAGNOSIS — M25641 Stiffness of right hand, not elsewhere classified: Secondary | ICD-10-CM | POA: Diagnosis not present

## 2021-10-12 DIAGNOSIS — S62616S Displaced fracture of proximal phalanx of right little finger, sequela: Secondary | ICD-10-CM | POA: Diagnosis not present

## 2021-10-12 DIAGNOSIS — S62616P Displaced fracture of proximal phalanx of right little finger, subsequent encounter for fracture with malunion: Secondary | ICD-10-CM | POA: Diagnosis not present

## 2021-10-12 DIAGNOSIS — M21242 Flexion deformity, left finger joints: Secondary | ICD-10-CM | POA: Diagnosis not present

## 2021-10-17 DIAGNOSIS — N3946 Mixed incontinence: Secondary | ICD-10-CM | POA: Diagnosis not present

## 2021-10-17 DIAGNOSIS — R350 Frequency of micturition: Secondary | ICD-10-CM | POA: Diagnosis not present

## 2021-10-30 DIAGNOSIS — G4733 Obstructive sleep apnea (adult) (pediatric): Secondary | ICD-10-CM | POA: Diagnosis not present

## 2022-01-08 DIAGNOSIS — H52201 Unspecified astigmatism, right eye: Secondary | ICD-10-CM | POA: Diagnosis not present

## 2022-01-08 DIAGNOSIS — Z85828 Personal history of other malignant neoplasm of skin: Secondary | ICD-10-CM | POA: Diagnosis not present

## 2022-01-08 DIAGNOSIS — L820 Inflamed seborrheic keratosis: Secondary | ICD-10-CM | POA: Diagnosis not present

## 2022-01-08 DIAGNOSIS — Z961 Presence of intraocular lens: Secondary | ICD-10-CM | POA: Diagnosis not present

## 2022-01-08 DIAGNOSIS — L821 Other seborrheic keratosis: Secondary | ICD-10-CM | POA: Diagnosis not present

## 2022-01-08 DIAGNOSIS — L812 Freckles: Secondary | ICD-10-CM | POA: Diagnosis not present

## 2022-01-08 DIAGNOSIS — H524 Presbyopia: Secondary | ICD-10-CM | POA: Diagnosis not present

## 2022-01-08 DIAGNOSIS — H5213 Myopia, bilateral: Secondary | ICD-10-CM | POA: Diagnosis not present

## 2022-01-10 DIAGNOSIS — Z1231 Encounter for screening mammogram for malignant neoplasm of breast: Secondary | ICD-10-CM | POA: Diagnosis not present

## 2022-01-30 DIAGNOSIS — G4733 Obstructive sleep apnea (adult) (pediatric): Secondary | ICD-10-CM | POA: Diagnosis not present

## 2022-02-26 ENCOUNTER — Encounter: Payer: Self-pay | Admitting: Gastroenterology

## 2022-03-06 ENCOUNTER — Encounter: Payer: Self-pay | Admitting: Gastroenterology

## 2022-03-16 DIAGNOSIS — Z23 Encounter for immunization: Secondary | ICD-10-CM | POA: Diagnosis not present

## 2022-03-16 DIAGNOSIS — R7303 Prediabetes: Secondary | ICD-10-CM | POA: Diagnosis not present

## 2022-03-16 DIAGNOSIS — R600 Localized edema: Secondary | ICD-10-CM | POA: Diagnosis not present

## 2022-03-16 DIAGNOSIS — F411 Generalized anxiety disorder: Secondary | ICD-10-CM | POA: Diagnosis not present

## 2022-03-16 DIAGNOSIS — I10 Essential (primary) hypertension: Secondary | ICD-10-CM | POA: Diagnosis not present

## 2022-04-11 ENCOUNTER — Telehealth: Payer: Self-pay | Admitting: *Deleted

## 2022-04-11 ENCOUNTER — Ambulatory Visit (AMBULATORY_SURGERY_CENTER): Payer: Self-pay | Admitting: *Deleted

## 2022-04-11 VITALS — Ht 66.0 in | Wt 331.0 lb

## 2022-04-11 DIAGNOSIS — Z1211 Encounter for screening for malignant neoplasm of colon: Secondary | ICD-10-CM

## 2022-04-11 MED ORDER — NA SULFATE-K SULFATE-MG SULF 17.5-3.13-1.6 GM/177ML PO SOLN: 2.0000 | Freq: Once | ORAL | 0 refills | Status: AC

## 2022-04-11 NOTE — Telephone Encounter (Signed)
OV first. Thanks.

## 2022-04-11 NOTE — Telephone Encounter (Signed)
Dr Bryan Lemma- during today's PV appt., pt's BMI was 53.42. She is scheduled for a recall colon with you on 7/19. Would you like an OV or direct to [facility]?  Thank you,  Lattie Haw PV

## 2022-04-11 NOTE — Progress Notes (Signed)
No egg or soy allergy known to patient  No issues known to pt with past sedation with any surgeries or procedures Patient denies ever being told they had issues or difficulty with intubation  No FH of Malignant Hyperthermia Pt is not on diet pills Pt is not on  home 02  Pt is not on blood thinners  Pt denies issues with constipation  No A fib or A flutter Pt's BMI 53.42 at today's PV. PV completed, let pt know we would have to ask Dr Bryan Lemma about OV vs. Direct to hospital for procedure. Let pt know we would call her either way to let her know. Sent her home with blank hospital instructions and explained instructions so she would be familiar with process when we get her date.   Discussed with pt there will be an out-of-pocket cost for prep and that varies from $0 to 70 +  dollars - pt verbalized understanding  Pt instructed to use Singlecare.com or GoodRx for a price reduction on prep   PV completed in person. Pt verified name, DOB.  Procedure explained to pt. Prep instructions reviewed, questions answered. Pt encouraged to call with questions or issues.  If pt has My chart, procedure instructions sent via My Chart

## 2022-04-25 DIAGNOSIS — G4733 Obstructive sleep apnea (adult) (pediatric): Secondary | ICD-10-CM | POA: Diagnosis not present

## 2022-04-26 DIAGNOSIS — S62616S Displaced fracture of proximal phalanx of right little finger, sequela: Secondary | ICD-10-CM | POA: Diagnosis not present

## 2022-04-26 DIAGNOSIS — M25541 Pain in joints of right hand: Secondary | ICD-10-CM | POA: Diagnosis not present

## 2022-05-04 DIAGNOSIS — G4733 Obstructive sleep apnea (adult) (pediatric): Secondary | ICD-10-CM | POA: Diagnosis not present

## 2022-05-08 ENCOUNTER — Ambulatory Visit: Payer: PPO | Admitting: Physician Assistant

## 2022-05-09 ENCOUNTER — Encounter: Payer: PPO | Admitting: Gastroenterology

## 2022-05-15 DIAGNOSIS — G4733 Obstructive sleep apnea (adult) (pediatric): Secondary | ICD-10-CM | POA: Diagnosis not present

## 2022-05-17 ENCOUNTER — Encounter: Payer: Self-pay | Admitting: Physician Assistant

## 2022-05-17 ENCOUNTER — Ambulatory Visit (INDEPENDENT_AMBULATORY_CARE_PROVIDER_SITE_OTHER): Payer: PPO | Admitting: Physician Assistant

## 2022-05-17 VITALS — BP 100/50 | HR 80 | Ht 65.75 in | Wt 334.1 lb

## 2022-05-17 DIAGNOSIS — Z1211 Encounter for screening for malignant neoplasm of colon: Secondary | ICD-10-CM

## 2022-05-17 DIAGNOSIS — Z01818 Encounter for other preprocedural examination: Secondary | ICD-10-CM | POA: Diagnosis not present

## 2022-05-17 NOTE — Patient Instructions (Signed)
You have been scheduled for a colonoscopy. Please follow written instructions given to you at your visit today.  Please pick up your prep supplies at the pharmacy within the next 1-3 days. If you use inhalers (even only as needed), please bring them with you on the day of your procedure.   If you are age 71 or older, your body mass index should be between 23-30. Your Body mass index is 54.34 kg/m. If this is out of the aforementioned range listed, please consider follow up with your Primary Care Provider.  If you are age 64 or younger, your body mass index should be between 19-25. Your Body mass index is 54.34 kg/m. If this is out of the aformentioned range listed, please consider follow up with your Primary Care Provider.   ________________________________________________________  The [place] [facility] providers would like to encourage you to use [facility] to communicate with providers for non-urgent requests or questions.  Due to long hold times on the telephone, sending your provider a message by [facility] may be a faster and more efficient way to get a response.  Please allow 48 business hours for a response.  Please remember that this is for non-urgent requests.  _______________________________________________________

## 2022-05-17 NOTE — Progress Notes (Signed)
Chief Complaint: Discuss Colonoscopy  HPI:    Crystal Potter is a 71 y/o female with a past medical history as listed below including reflux, known to Dr. Bryan Lemma, who presents to clinic today to discuss a colonoscopy.    12/07/2011 colonoscopy with Dr. Deatra Ina was normal and repeat recommended in 10 years.    Today, patient presents to clinic and tells me that she is due for her colonoscopy.  Aware that her BMI is high enough that this needs to be done in the hospital.  Denies any acute GI complaints or concerns.    Denies fever, chills, weight loss or blood in her stool.  Past Medical History:  Diagnosis Date   Anxiety    h/o panic attack- prior surgery for achilles    Arthritis    knees, back, hand -R    Bell's palsy 10/23/2003   DI (detrusor instability)    urgency- fr time to time    Fibroid    GERD (gastroesophageal reflux disease)    H/O hiatal hernia    History of kidney stones    Hypertension    followed by PCP, Dr. Nancy Fetter   Neuromuscular disorder [facility])    Bells Palsy - R side of face - 2005   Sleep apnea     Past Surgical History:  Procedure Laterality Date   ABDOMINAL HYSTERECTOMY  10/22/1993   TAH AND BLADDER NECK SUSPENSION   ACHILLES TENDON SURGERY  10/22/2010   L side    ACHILLES TENDON SURGERY Right    APPENDECTOMY     /w open cholecystectomy    BREAST SURGERY     REDUCTION MAMMAPLASTY   CARPAL TUNNEL RELEASE Bilateral 10/22/1986   CHOLECYSTECTOMY  10/22/1973   EYE LID SURGERY  10/22/2004   R eye, weighted for treatment fr. bell's palsy    LASIK     2004   TOTAL KNEE ARTHROPLASTY  02/27/2012   Procedure: TOTAL KNEE ARTHROPLASTY; left Surgeon: Ninetta Lights, MD;  Location: [place];  Service: Orthopedics;  Laterality: Left;   TOTAL KNEE ARTHROPLASTY Right 11/04/2013   Procedure: RIGHT TOTAL KNEE ARTHROPLASTY;  Surgeon: Ninetta Lights, MD;  Location: [place];  Service: Orthopedics;  Laterality: Right;   TUBAL LIGATION      Current Outpatient Medications   Medication Sig Dispense Refill   Ascorbic Acid (VITAMIN C) 1000 MG tablet Take 1,000 mg by mouth daily.     aspirin 81 MG chewable tablet Chew by mouth.     aspirin EC 325 MG tablet Take 1 tablet (325 mg total) by mouth daily. (Patient not taking: Reported on 04/11/2022) 30 tablet 0   Calcium Carbonate-Vitamin D (CALCIUM + D PO) Take by mouth.     COVID-19 mRNA bivalent vaccine, Pfizer, (PFIZER COVID-19 VAC BIVALENT) injection Inject into the muscle. 0.3 mL 0   COVID-19 mRNA Vac-TriS, Pfizer, (PFIZER-BIONT COVID-19 VAC-TRIS) SUSP injection Inject into the muscle. 0.3 mL 0   DULoxetine (CYMBALTA) 60 MG capsule Take 1 capsule (60 mg total) by mouth every morning. 90 capsule 3   esomeprazole (NEXIUM) 20 MG capsule Take 20 mg by mouth daily at 12 noon.     lisinopril-hydrochlorothiazide (PRINZIDE,ZESTORETIC) 20-12.5 MG per tablet Take 1 tablet by mouth 2 (two) times daily.      loratadine (CLARITIN) 10 MG tablet Take 10 mg by mouth daily.     methocarbamol (ROBAXIN) 500 MG tablet Take 1 tablet (500 mg total) by mouth 4 (four) times daily. (Patient not taking: Reported on  04/11/2022) 90 tablet 0   mirabegron ER (MYRBETRIQ) 25 MG TB24 tablet Take 25 mg by mouth daily.     MYRBETRIQ 50 MG TB24 tablet Take 50 mg by mouth daily.     ofloxacin (OCUFLOX) 0.3 % ophthalmic solution ofloxacin 0.3 % eye drops     omega-3 acid ethyl esters (LOVAZA) 1 G capsule Take 1 g by mouth daily.     oxyCODONE-acetaminophen (ROXICET) 5-325 MG per tablet Take 1-2 tablets by mouth every 4 (four) hours as needed for severe pain. (Patient not taking: Reported on 04/11/2022) 60 tablet 0   prednisoLONE acetate (PRED FORTE) 1 % ophthalmic suspension prednisolone acetate 1 % eye drops,suspension     Probiotic Product (PROBIOTIC PO) Take 2 capsules by mouth daily.  (Patient not taking: Reported on 04/11/2022)     Tdap (BOOSTRIX) 5-2.5-18.5 LF-MCG/0.5 injection Inject into the muscle. (Patient not taking: Reported on 04/11/2022) 0.5 mL 0    vitamin E 180 MG (400 UNITS) capsule Take by mouth.     No current facility-administered medications for this visit.    Allergies as of 05/17/2022   (No Known Allergies)    Family History  Problem Relation Age of Onset   Cancer Mother        BONE CANCER   Hypertension Mother    Breast cancer Mother        Age 53   Breast cancer Maternal Aunt        Age 55's   Cancer Maternal Uncle        Colon cancer   Diabetes Maternal Uncle    Diabetes Paternal Aunt    Diabetes Maternal Grandmother    Colon cancer Neg Hx    Anesthesia problems Neg Hx    Colon polyps Neg Hx    Esophageal cancer Neg Hx    Stomach cancer Neg Hx    Rectal cancer Neg Hx     Social History   Socioeconomic History   Marital status: Married    Spouse name: Not on file   Number of children: Not on file   Years of education: Not on file   Highest education level: Not on file  Occupational History   Not on file  Tobacco Use   Smoking status: Never   Smokeless tobacco: Never   Tobacco comments:    occ alcohol  Vaping Use   Vaping Use: Never used  Substance and Sexual Activity   Alcohol use: Yes    Comment: rare   Drug use: No   Sexual activity: Yes    Birth control/protection: Surgical  Other Topics Concern   Not on file  Social History Narrative   Not on file   Social Determinants of Health   Financial Resource Strain: Not on file  Food Insecurity: Not on file  Transportation Needs: Not on file  Physical Activity: Not on file  Stress: Not on file  Social Connections: Not on file  Intimate Partner Violence: Not on file    Review of Systems:    Constitutional: No weight loss, fever or chills Skin: No rash Cardiovascular: No chest pain Respiratory: No SOB  Gastrointestinal: See HPI and otherwise negative Genitourinary: No dysuria  Neurological: No headache, dizziness or syncope Musculoskeletal: No new muscle or joint pain Hematologic: No bleeding  Psychiatric: No history of  depression or anxiety   Physical Exam:  Vital signs: BP (!) 100/50 (BP Location: Left Wrist, Patient Position: Sitting, Cuff Size: Normal)   Pulse 80   Ht  5' 5.75" (1.67 m) Comment: height measured without shoes  Wt (!) 334 lb 2 oz (151.6 kg)   BMI 54.34 kg/m   Constitutional:   Pleasant morbidly obese Caucasian female appears to be in NAD, Well developed, Well nourished, alert and cooperative Respiratory: Respirations even and unlabored. Lungs clear to auscultation bilaterally.   No wheezes, crackles, or rhonchi.  Cardiovascular: Normal S1, S2. No MRG. Regular rate and rhythm. No peripheral edema, cyanosis or pallor.  Gastrointestinal:  Soft, nondistended, nontender. No rebound or guarding. Normal bowel sounds. No appreciable masses or hepatomegaly. Rectal:  Not performed.  Psychiatric: Oriented to person, place and time. Demonstrates good judgement and reason without abnormal affect or behaviors.  No recent labs or imaging.  Assessment: 1.  Screening for colorectal cancer: Last colonoscopy 10 years ago, repeat recommended in 10 years, patient due 9 2.  Morbid obesity: BMI 54.34 today  Plan: 1.  Patient will be scheduled for a screening colonoscopy at the hospital given her BMI greater than 50 with Dr. Bryan Lemma.  Did provide the patient a detailed list of risks for the procedure and she agrees to proceed. 2.  Patient to follow in clinic per recommendations after time of colonoscopy.  Ellouise Newer, PA-C [facility] 05/17/2022, 11:32 AM  Cc: Donald Prose, MD

## 2022-05-24 NOTE — Progress Notes (Signed)
Agree with the assessment and plan as outlined by Jennifer Lemmon, PA-C. ? ?Enyah Moman, DO, FACG ? ?

## 2022-06-11 DIAGNOSIS — G4733 Obstructive sleep apnea (adult) (pediatric): Secondary | ICD-10-CM | POA: Diagnosis not present

## 2022-06-21 DIAGNOSIS — G4733 Obstructive sleep apnea (adult) (pediatric): Secondary | ICD-10-CM | POA: Diagnosis not present

## 2022-07-10 ENCOUNTER — Encounter (HOSPITAL_COMMUNITY): Payer: Self-pay | Admitting: Gastroenterology

## 2022-07-10 ENCOUNTER — Other Ambulatory Visit: Payer: Self-pay

## 2022-07-16 DIAGNOSIS — D1801 Hemangioma of skin and subcutaneous tissue: Secondary | ICD-10-CM | POA: Diagnosis not present

## 2022-07-16 DIAGNOSIS — L821 Other seborrheic keratosis: Secondary | ICD-10-CM | POA: Diagnosis not present

## 2022-07-16 DIAGNOSIS — D485 Neoplasm of uncertain behavior of skin: Secondary | ICD-10-CM | POA: Diagnosis not present

## 2022-07-16 DIAGNOSIS — D045 Carcinoma in situ of skin of trunk: Secondary | ICD-10-CM | POA: Diagnosis not present

## 2022-07-16 DIAGNOSIS — L812 Freckles: Secondary | ICD-10-CM | POA: Diagnosis not present

## 2022-07-16 DIAGNOSIS — Z85828 Personal history of other malignant neoplasm of skin: Secondary | ICD-10-CM | POA: Diagnosis not present

## 2022-07-16 DIAGNOSIS — L570 Actinic keratosis: Secondary | ICD-10-CM | POA: Diagnosis not present

## 2022-07-16 DIAGNOSIS — L820 Inflamed seborrheic keratosis: Secondary | ICD-10-CM | POA: Diagnosis not present

## 2022-07-17 ENCOUNTER — Ambulatory Visit (HOSPITAL_BASED_OUTPATIENT_CLINIC_OR_DEPARTMENT_OTHER): Payer: PPO | Admitting: Anesthesiology

## 2022-07-17 ENCOUNTER — Encounter (HOSPITAL_COMMUNITY)
Admission: RE | Disposition: A | Discharge status: Home / Self Care | Payer: Self-pay | Source: Ambulatory Visit | Attending: Gastroenterology

## 2022-07-17 ENCOUNTER — Other Ambulatory Visit: Payer: Self-pay

## 2022-07-17 ENCOUNTER — Ambulatory Visit (HOSPITAL_COMMUNITY): Payer: PPO | Admitting: Anesthesiology

## 2022-07-17 ENCOUNTER — Ambulatory Visit (HOSPITAL_COMMUNITY)
Admission: RE | Admit: 2022-07-17 | Discharge: 2022-07-17 | Disposition: A | Discharge status: Home / Self Care | Payer: PPO | Source: Ambulatory Visit | Attending: Gastroenterology | Admitting: Gastroenterology

## 2022-07-17 ENCOUNTER — Encounter (HOSPITAL_COMMUNITY): Payer: Self-pay | Admitting: Gastroenterology

## 2022-07-17 DIAGNOSIS — K449 Diaphragmatic hernia without obstruction or gangrene: Secondary | ICD-10-CM | POA: Insufficient documentation

## 2022-07-17 DIAGNOSIS — K644 Residual hemorrhoidal skin tags: Secondary | ICD-10-CM | POA: Insufficient documentation

## 2022-07-17 DIAGNOSIS — Z6841 Body Mass Index (BMI) 40.0 and over, adult: Secondary | ICD-10-CM | POA: Insufficient documentation

## 2022-07-17 DIAGNOSIS — K5730 Diverticulosis of large intestine without perforation or abscess without bleeding: Secondary | ICD-10-CM

## 2022-07-17 DIAGNOSIS — I10 Essential (primary) hypertension: Secondary | ICD-10-CM | POA: Diagnosis not present

## 2022-07-17 DIAGNOSIS — K648 Other hemorrhoids: Secondary | ICD-10-CM | POA: Insufficient documentation

## 2022-07-17 DIAGNOSIS — Z1211 Encounter for screening for malignant neoplasm of colon: Secondary | ICD-10-CM | POA: Diagnosis not present

## 2022-07-17 DIAGNOSIS — K641 Second degree hemorrhoids: Secondary | ICD-10-CM

## 2022-07-17 DIAGNOSIS — K219 Gastro-esophageal reflux disease without esophagitis: Secondary | ICD-10-CM | POA: Insufficient documentation

## 2022-07-17 DIAGNOSIS — M1990 Unspecified osteoarthritis, unspecified site: Secondary | ICD-10-CM | POA: Insufficient documentation

## 2022-07-17 DIAGNOSIS — F419 Anxiety disorder, unspecified: Secondary | ICD-10-CM | POA: Insufficient documentation

## 2022-07-17 DIAGNOSIS — G4730 Sleep apnea, unspecified: Secondary | ICD-10-CM | POA: Insufficient documentation

## 2022-07-17 DIAGNOSIS — K6289 Other specified diseases of anus and rectum: Secondary | ICD-10-CM | POA: Diagnosis not present

## 2022-07-17 HISTORY — PX: COLONOSCOPY WITH PROPOFOL: SHX5780

## 2022-07-17 SURGERY — COLONOSCOPY WITH PROPOFOL
Anesthesia: Monitor Anesthesia Care

## 2022-07-17 MED ORDER — PROPOFOL 10 MG/ML IV BOLUS
INTRAVENOUS | Status: AC
  Filled 2022-07-17: qty 20

## 2022-07-17 MED ORDER — LACTATED RINGERS IV SOLN: INTRAVENOUS | Status: DC | PRN

## 2022-07-17 MED ORDER — PROPOFOL 500 MG/50ML IV EMUL
INTRAVENOUS | Status: DC | PRN
  Administered 2022-07-17: 125 ug/kg/min via INTRAVENOUS

## 2022-07-17 MED ORDER — LIDOCAINE 2% (20 MG/ML) 5 ML SYRINGE
INTRAMUSCULAR | Status: DC | PRN
  Administered 2022-07-17: 100 mg via INTRAVENOUS

## 2022-07-17 MED ORDER — PROPOFOL 1000 MG/100ML IV EMUL
INTRAVENOUS | Status: AC
  Filled 2022-07-17: qty 100

## 2022-07-17 MED ORDER — PROPOFOL 10 MG/ML IV BOLUS
INTRAVENOUS | Status: DC | PRN
  Administered 2022-07-17 (×4): 20 mg via INTRAVENOUS

## 2022-07-17 SURGICAL SUPPLY — 22 items

## 2022-07-17 NOTE — Interval H&P Note (Signed)
History and Physical Interval Note:  07/17/2022 11:03 AM  Crystal Potter  has presented today for surgery, with the diagnosis of screening crc.  The various methods of treatment have been discussed with the patient and family. After consideration of risks, benefits and other options for treatment, the patient has consented to  Procedure(s): COLONOSCOPY WITH PROPOFOL (N/A) as a surgical intervention.  The patient's history has been reviewed, patient examined, no change in status, stable for surgery.  I have reviewed the patient's chart and labs.  Questions were answered to the patient's satisfaction.     [name] [name]

## 2022-07-17 NOTE — Anesthesia Procedure Notes (Signed)
Date/Time: 07/17/2022 11:18 AM  Performed by: Sharlette Dense, CRNAOxygen Delivery Method: Simple face mask

## 2022-07-17 NOTE — Anesthesia Postprocedure Evaluation (Signed)
Anesthesia Post Note  Patient: Crystal Potter  Procedure(s) Performed: COLONOSCOPY WITH PROPOFOL     Patient location during evaluation: Endoscopy Anesthesia Type: MAC Level of consciousness: awake and alert Pain management: pain level controlled Vital Signs Assessment: post-procedure vital signs reviewed and stable Respiratory status: spontaneous breathing, nonlabored ventilation and respiratory function stable Cardiovascular status: stable and blood pressure returned to baseline Postop Assessment: no apparent nausea or vomiting Anesthetic complications: no   No notable events documented.  Last Vitals:  Vitals:   07/17/22 1150 07/17/22 1200  BP: 121/70 135/63  Pulse: 79 76  Resp: 20 16  Temp: 36.8 C   SpO2: 96% 96%    Last Pain:  Vitals:   07/17/22 1200  TempSrc:   PainSc: 0-No pain                 Earlisha Sharples,W. EDMOND

## 2022-07-17 NOTE — H&P (Signed)
GASTROENTEROLOGY PROCEDURE H&P NOTE   Primary Care Physician: Donald Prose, MD    Reason for Procedure:  Colon Cancer screening  Plan:    Colonoscopy  Patient is appropriate for endoscopic procedure at [facility] Endoscopy unit.  The nature of the procedure, as well as the risks, benefits, and alternatives were carefully and thoroughly reviewed with the patient. Ample time for discussion and questions allowed. The patient understood, was satisfied, and agreed to proceed.     HPI: Crystal Potter is a 71 y.o. female who presents for colonoscopy for ongoing Colon Cancer screening.  No active GI symptoms.  Last colonoscopy was 11/2011 and normal.  No known family history of colon cancer or related malignancy.    BMI >50.  Due to elevated periprocedural risks, procedure scheduled at [facility] Endoscopy unit today.  Past Medical History:  Diagnosis Date   Anxiety    h/o panic attack- prior surgery for achilles    Arthritis    knees, back, hand -R    Bell's palsy 10/23/2003   DI (detrusor instability)    urgency- fr time to time    Fibroid    GERD (gastroesophageal reflux disease)    H/O hiatal hernia    History of kidney stones    Hypertension    followed by PCP, Dr. Nancy Fetter   Neuromuscular disorder [facility])    Bells Palsy - R side of face - 2005   Sleep apnea    CPAP    Past Surgical History:  Procedure Laterality Date   ABDOMINAL HYSTERECTOMY  10/22/1993   TAH AND BLADDER NECK SUSPENSION   ACHILLES TENDON SURGERY  10/22/2010   L side    ACHILLES TENDON SURGERY Right    APPENDECTOMY     /w open cholecystectomy    BREAST REDUCTION SURGERY     REDUCTION MAMMAPLASTY   CARPAL TUNNEL RELEASE Bilateral 10/22/1986   CHOLECYSTECTOMY  10/22/1973   EYE LID SURGERY  10/22/2004   R eye, weighted for treatment fr. bell's palsy    LASIK     2004   TOTAL KNEE ARTHROPLASTY  02/27/2012   Procedure: TOTAL KNEE ARTHROPLASTY; left Surgeon: Ninetta Lights, MD;   Location: [place];  Service: Orthopedics;  Laterality: Left;   TOTAL KNEE ARTHROPLASTY Right 11/04/2013   Procedure: RIGHT TOTAL KNEE ARTHROPLASTY;  Surgeon: Ninetta Lights, MD;  Location: [place];  Service: Orthopedics;  Laterality: Right;   TUBAL LIGATION      Prior to Admission medications   Medication Sig Start Date End Date Taking? Authorizing Provider  Ascorbic Acid (VITAMIN C) 1000 MG tablet Take 1,000 mg by mouth daily.   Yes [provider]  aspirin EC 81 MG tablet Take 81 mg by mouth daily.   Yes [provider]  Calcium Carbonate-Vitamin D (CALCIUM + D PO) Take 2 tablets by mouth daily.   Yes [provider]  DULoxetine (CYMBALTA) 60 MG capsule Take 1 capsule (60 mg total) by mouth every morning. 08/12/12  Yes Gottsegen, Cherly Anderson, MD  esomeprazole (NEXIUM) 20 MG capsule Take 20 mg by mouth daily.   Yes [provider]  lisinopril-hydrochlorothiazide (PRINZIDE,ZESTORETIC) 20-12.5 MG per tablet Take 2 tablets by mouth daily.   Yes [provider]  loratadine (CLARITIN) 10 MG tablet Take 10 mg by mouth daily.   Yes [provider]  mirabegron ER (MYRBETRIQ) 50 MG TB24 tablet Take 50 mg by mouth daily.   Yes [provider]  Omega-3 Fatty Acids (FISH OIL) 1000 MG CAPS Take 1,000 mg by mouth daily.   Yes [provider]  vitamin E 180 MG (400 UNITS) capsule Take 400 Units by mouth daily.   Yes [provider]    No current facility-administered medications for this encounter.    Allergies as of 05/17/2022   (No Known Allergies)    Family History  Problem Relation Age of Onset   Bone cancer Mother    Hypertension Mother    Breast cancer Mother        Age 70   Other Sister        prediabetes   Stroke Sister    Hyperlipidemia Sister    Diabetes Maternal Grandmother    Heart attack Maternal Grandmother    Breast cancer Maternal Aunt        Age 64's   Colon cancer Maternal Uncle    Diabetes  Maternal Uncle    Diabetes Paternal Aunt    Anesthesia problems Neg Hx    Colon polyps Neg Hx    Esophageal cancer Neg Hx    Stomach cancer Neg Hx    Rectal cancer Neg Hx     Social History   Socioeconomic History   Marital status: Married    Spouse name: Not on file   Number of children: 1   Years of education: Not on file   Highest education level: Not on file  Occupational History   Occupation: retired  Tobacco Use   Smoking status: Never   Smokeless tobacco: Never   Tobacco comments:    occ alcohol  Vaping Use   Vaping Use: Never used  Substance and Sexual Activity   Alcohol use: Not Currently    Comment: rare   Drug use: No   Sexual activity: Yes    Birth control/protection: Surgical  Other Topics Concern   Not on file  Social History Narrative   Not on file   Social Determinants of Health   Financial Resource Strain: Not on file  Food Insecurity: Not on file  Transportation Needs: Not on file  Physical Activity: Not on file  Stress: Not on file  Social Connections: Not on file  Intimate Partner Violence: Not on file    Physical Exam: Vital signs in last 24 hours: '@BP'$  (!) 139/48   Pulse 76   Temp 97.8 F (36.6 C) (Oral)   Resp (!) 21   Ht '5\' 6"'$  (1.676 m)   Wt (!) 147.4 kg   SpO2 93%   BMI 52.46 kg/m  GEN: NAD EYE: Sclerae anicteric ENT: MMM CV: Non-tachycardic Pulm: CTA b/l GI: Soft, NT/ND NEURO:  Alert & Oriented x 3   Gerrit Heck, DO [facility]   07/17/2022 11:02 AM

## 2022-07-17 NOTE — Op Note (Signed)
[facility] Patient Name: Crystal Potter Procedure Date: 07/17/2022 MRN: [id number] Attending MD: Gerrit Heck , MD Date of Birth: 09/15/1951 CSN: [id number] Age: 71 Admit Type: Outpatient Procedure:                Colonoscopy Indications:              Screening for colorectal malignant neoplasm. Last                            colonoscopy was 10 years ago and normal. No recent                            GI symptoms. Providers:                Gerrit Heck, MD, Earnstine Regal, RN, Benetta Spar, Technician Referring MD:              Medicines:                Monitored Anesthesia Care Complications:            No immediate complications. Estimated Blood Loss:     Estimated blood loss: none. Procedure:                Pre-Anesthesia Assessment:                           - Prior to the procedure, a History and Physical                            was performed, and patient medications and                            allergies were reviewed. The patient's tolerance of                            previous anesthesia was also reviewed. The risks                            and benefits of the procedure and the sedation                            options and risks were discussed with the patient.                            All questions were answered, and informed consent                            was obtained. Prior Anticoagulants: The patient has                            taken no previous anticoagulant or antiplatelet                            agents. ASA Grade Assessment:  III - A patient with                            severe systemic disease. After reviewing the risks                            and benefits, the patient was deemed in                            satisfactory condition to undergo the procedure.                           After obtaining informed consent, the colonoscope                            was passed under direct vision.  Throughout the                            procedure, the patient's blood pressure, pulse, and                            oxygen saturations were monitored continuously. The                            CF-HQ190L ([id number]) Olympus colonoscope was                            introduced through the anus and advanced to the the                            cecum, identified by appendiceal orifice and                            ileocecal valve. The colonoscopy was performed                            without difficulty. The patient tolerated the                            procedure well. The quality of the bowel                            preparation was good. The ileocecal valve,                            appendiceal orifice, and rectum were photographed. Scope In: 11:26:17 AM Scope Out: 11:39:51 AM Scope Withdrawal Time: 0 hours 11 minutes 4 seconds  Total Procedure Duration: 0 hours 13 minutes 34 seconds  Findings:      Skin tags were found on perianal exam.      Multiple small-mouthed diverticula were found in the sigmoid colon,       descending colon and ascending colon.      Non-bleeding internal hemorrhoids and small hypertrophied anal papillae       were found during retroflexion. The hemorrhoids were small.  The exam was otherwise normal throughout the remainder of the colon. Impression:               - Perianal skin tags found on perianal exam.                           - Diverticulosis in the sigmoid colon, in the                            descending colon and in the ascending colon.                           - Non-bleeding internal hemorrhoids.                           - Small hypertrophied anal papillae noted on                            retroflexion.                           - No specimens collected. Moderate Sedation:      Not Applicable - Patient had care per Anesthesia. Recommendation:           - Patient has a contact number available for                             emergencies. The signs and symptoms of potential                            delayed complications were discussed with the                            patient. Return to normal activities tomorrow.                            Written discharge instructions were provided to the                            patient.                           - Resume previous diet.                           - Continue present medications.                           - Based on today's exam and the prior colonoscopy                            (als normal, no polyps), repeat colonoscopy is not                            recommended for screening purposes due to current  age (15 years or older).                           - Return to GI clinic PRN. Procedure Code(s):        --- Professional ---                           E4975, Colorectal cancer screening; colonoscopy on                            individual not meeting criteria for high risk Diagnosis Code(s):        --- Professional ---                           Z12.11, Encounter for screening for malignant                            neoplasm of colon                           K64.8, Other hemorrhoids                           K64.4, Residual hemorrhoidal skin tags                           K57.30, Diverticulosis of large intestine without                            perforation or abscess without bleeding CPT copyright 2019 American Medical Association. All rights reserved. The codes documented in this report are preliminary and upon coder review may  be revised to meet current compliance requirements. Gerrit Heck, MD 07/17/2022 11:46:20 AM Number of Addenda: 0

## 2022-07-17 NOTE — Transfer of Care (Signed)
Immediate Anesthesia Transfer of Care Note  Patient: Crystal Potter  Procedure(s) Performed: COLONOSCOPY WITH PROPOFOL  Patient Location: Endoscopy Unit  Anesthesia Type:MAC  Level of Consciousness: awake and alert   Airway & Oxygen Therapy: Patient Spontanous Breathing and Patient connected to face mask oxygen  Post-op Assessment: Report given to RN and Post -op Vital signs reviewed and stable  Post vital signs: Reviewed and stable  Last Vitals:  Vitals Value Taken Time  BP    Temp    Pulse    Resp    SpO2      Last Pain:  Vitals:   07/17/22 1014  TempSrc: Oral  PainSc: 0-No pain         Complications: No notable events documented.

## 2022-07-17 NOTE — Discharge Instructions (Signed)
YOU HAD AN ENDOSCOPIC PROCEDURE TODAY: Refer to the procedure report and other information in the discharge instructions given to you for any specific questions about what was found during the examination. If this information does not answer your questions, please call [place] office at [phone] to clarify.  ° °YOU SHOULD EXPECT: Some feelings of bloating in the abdomen. Passage of more gas than usual. Walking can help get rid of the air that was put into your GI tract during the procedure and reduce the bloating. If you had a lower endoscopy (such as a colonoscopy or flexible sigmoidoscopy) you may notice spotting of blood in your stool or on the toilet paper. Some abdominal soreness may be present for a day or two, also. ° °DIET: Your first meal following the procedure should be a light meal and then it is ok to progress to your normal diet. A half-sandwich or bowl of soup is an example of a good first meal. Heavy or fried foods are harder to digest and may make you feel nauseous or bloated. Drink plenty of fluids but you should avoid alcoholic beverages for 24 hours. If you had a esophageal dilation, please see attached instructions for diet.   ° °ACTIVITY: Your care partner should take you home directly after the procedure. You should plan to take it easy, moving slowly for the rest of the day. You can resume normal activity the day after the procedure however YOU SHOULD NOT DRIVE, use power tools, machinery or perform tasks that involve climbing or major physical exertion for 24 hours (because of the sedation medicines used during the test).  ° °SYMPTOMS TO REPORT IMMEDIATELY: °A gastroenterologist can be reached at any hour. Please call [phone]  for any of the following symptoms:  °Following lower endoscopy (colonoscopy, flexible sigmoidoscopy) °Excessive amounts of blood in the stool  °Significant tenderness, worsening of abdominal pains  °Swelling of the abdomen that is new, acute  °Fever of 100° or  higher  °Following upper endoscopy (EGD, EUS, ERCP, esophageal dilation) °Vomiting of blood or coffee ground material  °New, significant abdominal pain  °New, significant chest pain or pain under the shoulder blades  °Painful or persistently difficult swallowing  °New shortness of breath  °Black, tarry-looking or red, bloody stools ° °FOLLOW UP:  °If any biopsies were taken you will be contacted by phone or by letter within the next 1-3 weeks. Call [phone]  if you have not heard about the biopsies in 3 weeks.  °Please also call with any specific questions about appointments or follow up tests. ° °

## 2022-07-17 NOTE — Anesthesia Preprocedure Evaluation (Addendum)
Anesthesia Evaluation  Patient identified by MRN, date of birth, ID band Patient awake    Reviewed: Allergy & Precautions, H&P , NPO status , Patient's Chart, lab work & pertinent test results  Airway Mallampati: II  TM Distance: >3 FB Neck ROM: Full    Dental no notable dental hx. (+) Upper Dentures, Edentulous Lower, Dental Advisory Given   Pulmonary sleep apnea and Continuous Positive Airway Pressure Ventilation ,    Pulmonary exam normal breath sounds clear to auscultation       Cardiovascular hypertension, Pt. on medications  Rhythm:Regular Rate:Normal     Neuro/Psych Anxiety negative neurological ROS     GI/Hepatic Neg liver ROS, hiatal hernia, GERD  Medicated,  Endo/Other  Morbid obesity  Renal/GU negative Renal ROS  negative genitourinary   Musculoskeletal  (+) Arthritis , Osteoarthritis,    Abdominal   Peds  Hematology negative hematology ROS (+)   Anesthesia Other Findings   Reproductive/Obstetrics negative OB ROS                            Anesthesia Physical Anesthesia Plan  ASA: 3  Anesthesia Plan: MAC   Post-op Pain Management: Minimal or no pain anticipated   Induction: Intravenous  PONV Risk Score and Plan: 2 and Treatment may vary due to age or medical condition  Airway Management Planned: Natural Airway and Simple Face Mask  Additional Equipment:   Intra-op Plan:   Post-operative Plan:   Informed Consent: I have reviewed the patients History and Physical, chart, labs and discussed the procedure including the risks, benefits and alternatives for the proposed anesthesia with the patient or authorized representative who has indicated his/her understanding and acceptance.     Dental advisory given  Plan Discussed with: CRNA  Anesthesia Plan Comments:         Anesthesia Quick Evaluation

## 2022-07-18 ENCOUNTER — Encounter (HOSPITAL_COMMUNITY): Payer: Self-pay | Admitting: Gastroenterology

## 2022-07-21 DIAGNOSIS — G4733 Obstructive sleep apnea (adult) (pediatric): Secondary | ICD-10-CM | POA: Diagnosis not present

## 2022-08-13 DIAGNOSIS — G4733 Obstructive sleep apnea (adult) (pediatric): Secondary | ICD-10-CM | POA: Diagnosis not present

## 2022-08-21 DIAGNOSIS — G4733 Obstructive sleep apnea (adult) (pediatric): Secondary | ICD-10-CM | POA: Diagnosis not present

## 2022-09-05 DIAGNOSIS — J209 Acute bronchitis, unspecified: Secondary | ICD-10-CM | POA: Diagnosis not present

## 2022-09-17 DIAGNOSIS — I10 Essential (primary) hypertension: Secondary | ICD-10-CM | POA: Diagnosis not present

## 2022-09-17 DIAGNOSIS — G4733 Obstructive sleep apnea (adult) (pediatric): Secondary | ICD-10-CM | POA: Diagnosis not present

## 2022-09-21 DIAGNOSIS — M79601 Pain in right arm: Secondary | ICD-10-CM | POA: Diagnosis not present

## 2022-09-21 DIAGNOSIS — Z1331 Encounter for screening for depression: Secondary | ICD-10-CM | POA: Diagnosis not present

## 2022-09-21 DIAGNOSIS — Z0000 Encounter for general adult medical examination without abnormal findings: Secondary | ICD-10-CM | POA: Diagnosis not present

## 2022-09-21 DIAGNOSIS — Z23 Encounter for immunization: Secondary | ICD-10-CM | POA: Diagnosis not present

## 2022-09-21 DIAGNOSIS — F411 Generalized anxiety disorder: Secondary | ICD-10-CM | POA: Diagnosis not present

## 2022-09-21 DIAGNOSIS — N3281 Overactive bladder: Secondary | ICD-10-CM | POA: Diagnosis not present

## 2022-09-21 DIAGNOSIS — G4733 Obstructive sleep apnea (adult) (pediatric): Secondary | ICD-10-CM | POA: Diagnosis not present

## 2022-09-21 DIAGNOSIS — K219 Gastro-esophageal reflux disease without esophagitis: Secondary | ICD-10-CM | POA: Diagnosis not present

## 2022-09-21 DIAGNOSIS — J209 Acute bronchitis, unspecified: Secondary | ICD-10-CM | POA: Diagnosis not present

## 2022-09-21 DIAGNOSIS — I10 Essential (primary) hypertension: Secondary | ICD-10-CM | POA: Diagnosis not present

## 2022-09-28 DIAGNOSIS — G4733 Obstructive sleep apnea (adult) (pediatric): Secondary | ICD-10-CM | POA: Diagnosis not present

## 2022-10-01 DIAGNOSIS — M19011 Primary osteoarthritis, right shoulder: Secondary | ICD-10-CM | POA: Diagnosis not present

## 2022-10-01 DIAGNOSIS — M25511 Pain in right shoulder: Secondary | ICD-10-CM | POA: Diagnosis not present

## 2022-10-02 DIAGNOSIS — G4733 Obstructive sleep apnea (adult) (pediatric): Secondary | ICD-10-CM | POA: Diagnosis not present

## 2022-10-16 DIAGNOSIS — I10 Essential (primary) hypertension: Secondary | ICD-10-CM | POA: Diagnosis not present

## 2022-10-16 DIAGNOSIS — E8889 Other specified metabolic disorders: Secondary | ICD-10-CM | POA: Diagnosis not present

## 2022-10-16 DIAGNOSIS — K219 Gastro-esophageal reflux disease without esophagitis: Secondary | ICD-10-CM | POA: Diagnosis not present

## 2022-10-16 DIAGNOSIS — Z6841 Body Mass Index (BMI) 40.0 and over, adult: Secondary | ICD-10-CM | POA: Diagnosis not present

## 2022-10-16 DIAGNOSIS — Z1331 Encounter for screening for depression: Secondary | ICD-10-CM | POA: Diagnosis not present

## 2022-10-16 DIAGNOSIS — R7303 Prediabetes: Secondary | ICD-10-CM | POA: Diagnosis not present

## 2022-10-30 DIAGNOSIS — Z6841 Body Mass Index (BMI) 40.0 and over, adult: Secondary | ICD-10-CM | POA: Diagnosis not present

## 2022-10-30 DIAGNOSIS — I10 Essential (primary) hypertension: Secondary | ICD-10-CM | POA: Diagnosis not present

## 2022-10-30 DIAGNOSIS — R7303 Prediabetes: Secondary | ICD-10-CM | POA: Diagnosis not present

## 2022-11-13 DIAGNOSIS — I10 Essential (primary) hypertension: Secondary | ICD-10-CM | POA: Diagnosis not present

## 2022-11-13 DIAGNOSIS — Z6841 Body Mass Index (BMI) 40.0 and over, adult: Secondary | ICD-10-CM | POA: Diagnosis not present

## 2022-11-13 DIAGNOSIS — R7303 Prediabetes: Secondary | ICD-10-CM | POA: Diagnosis not present

## 2022-11-21 DIAGNOSIS — G4733 Obstructive sleep apnea (adult) (pediatric): Secondary | ICD-10-CM | POA: Diagnosis not present

## 2022-11-28 DIAGNOSIS — I10 Essential (primary) hypertension: Secondary | ICD-10-CM | POA: Diagnosis not present

## 2022-11-28 DIAGNOSIS — Z6841 Body Mass Index (BMI) 40.0 and over, adult: Secondary | ICD-10-CM | POA: Diagnosis not present

## 2022-11-28 DIAGNOSIS — R7303 Prediabetes: Secondary | ICD-10-CM | POA: Diagnosis not present

## 2022-12-01 IMAGING — CR DG ORBITS FOR FOREIGN BODY
2 series · 2 of 2 positions shown · non-contrast
Comparison: None.

CLINICAL DATA: Metal working/exposure; clearance prior to MRI

EXAM:
ORBITS FOR FOREIGN BODY - 2 VIEW

[w orbit pa (1 of 2)]
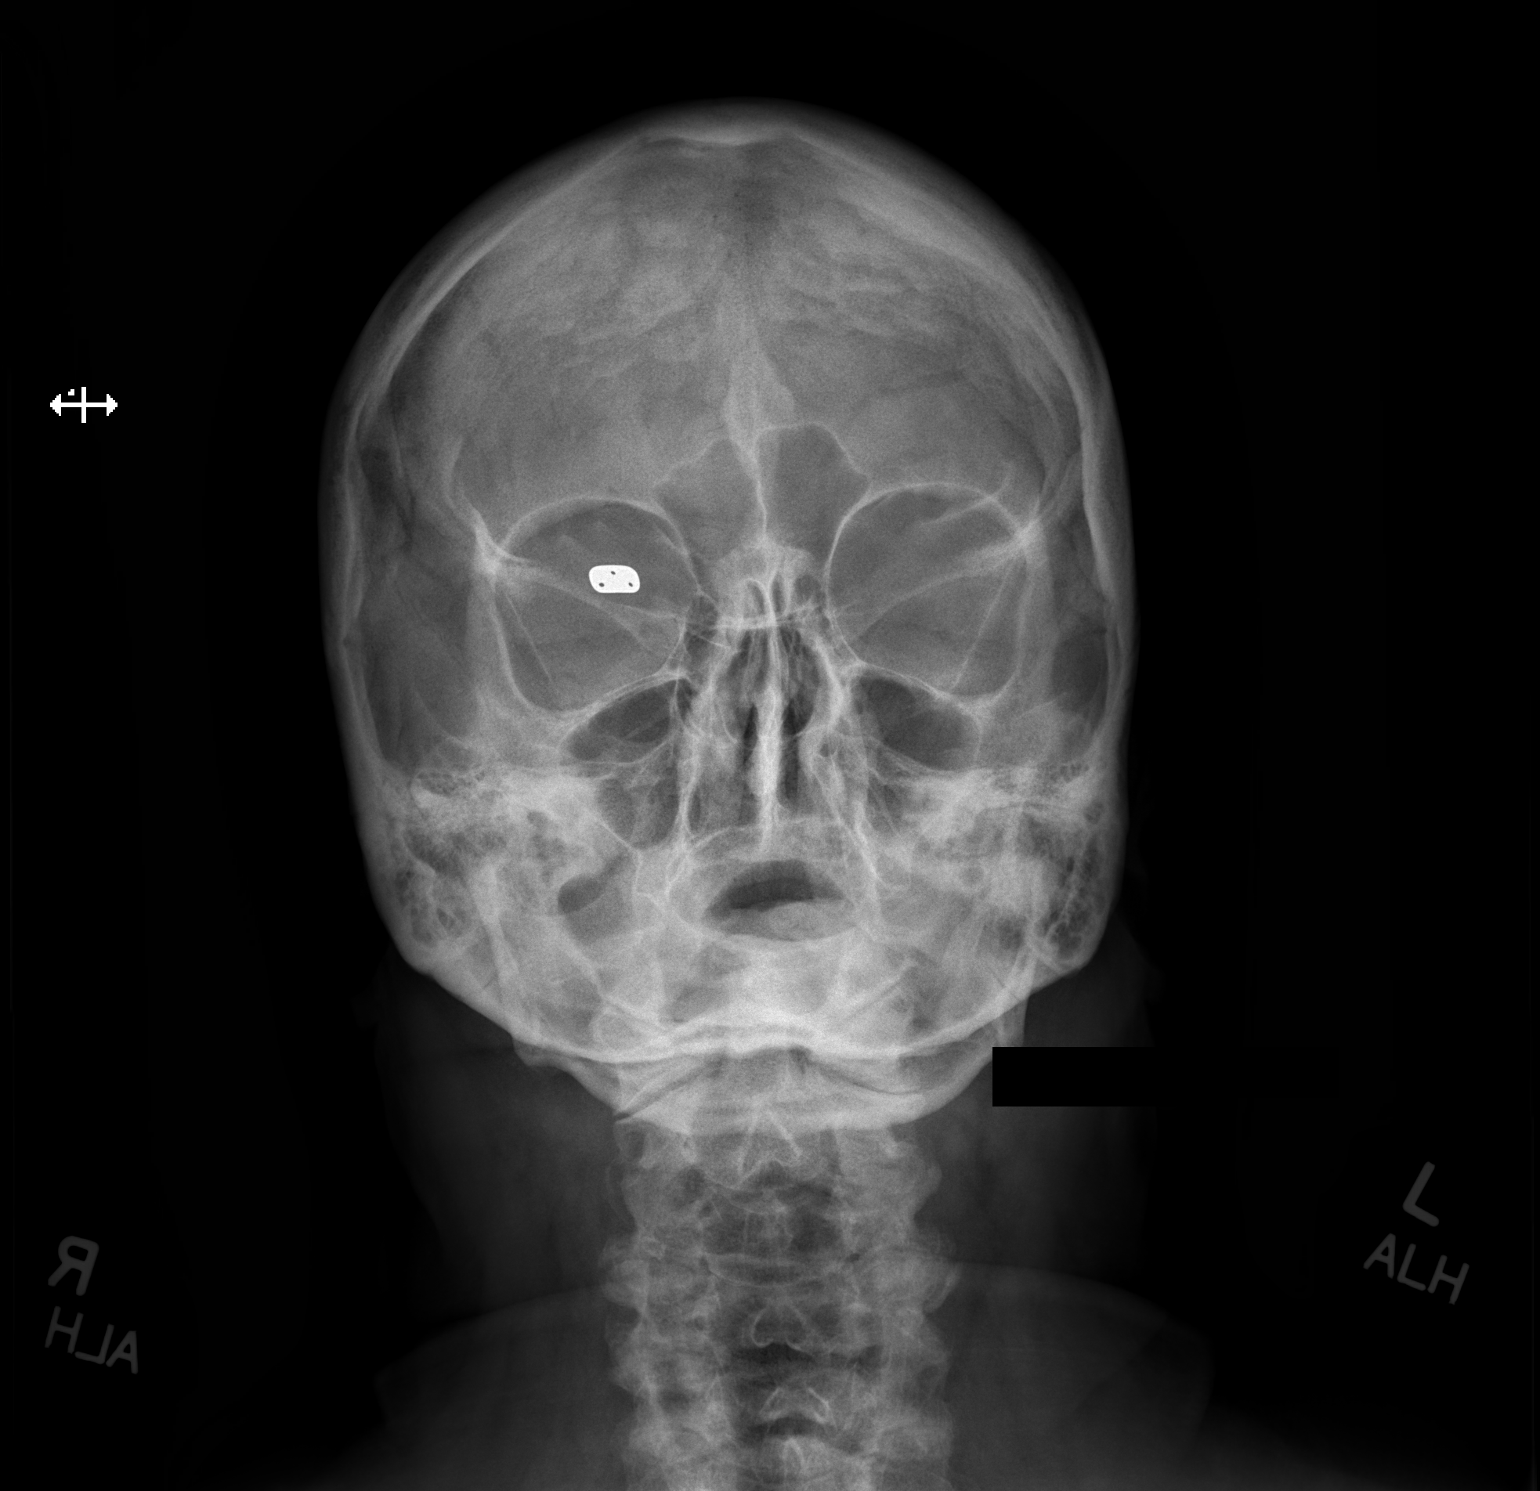

[w orbit pa (2 of 2)]
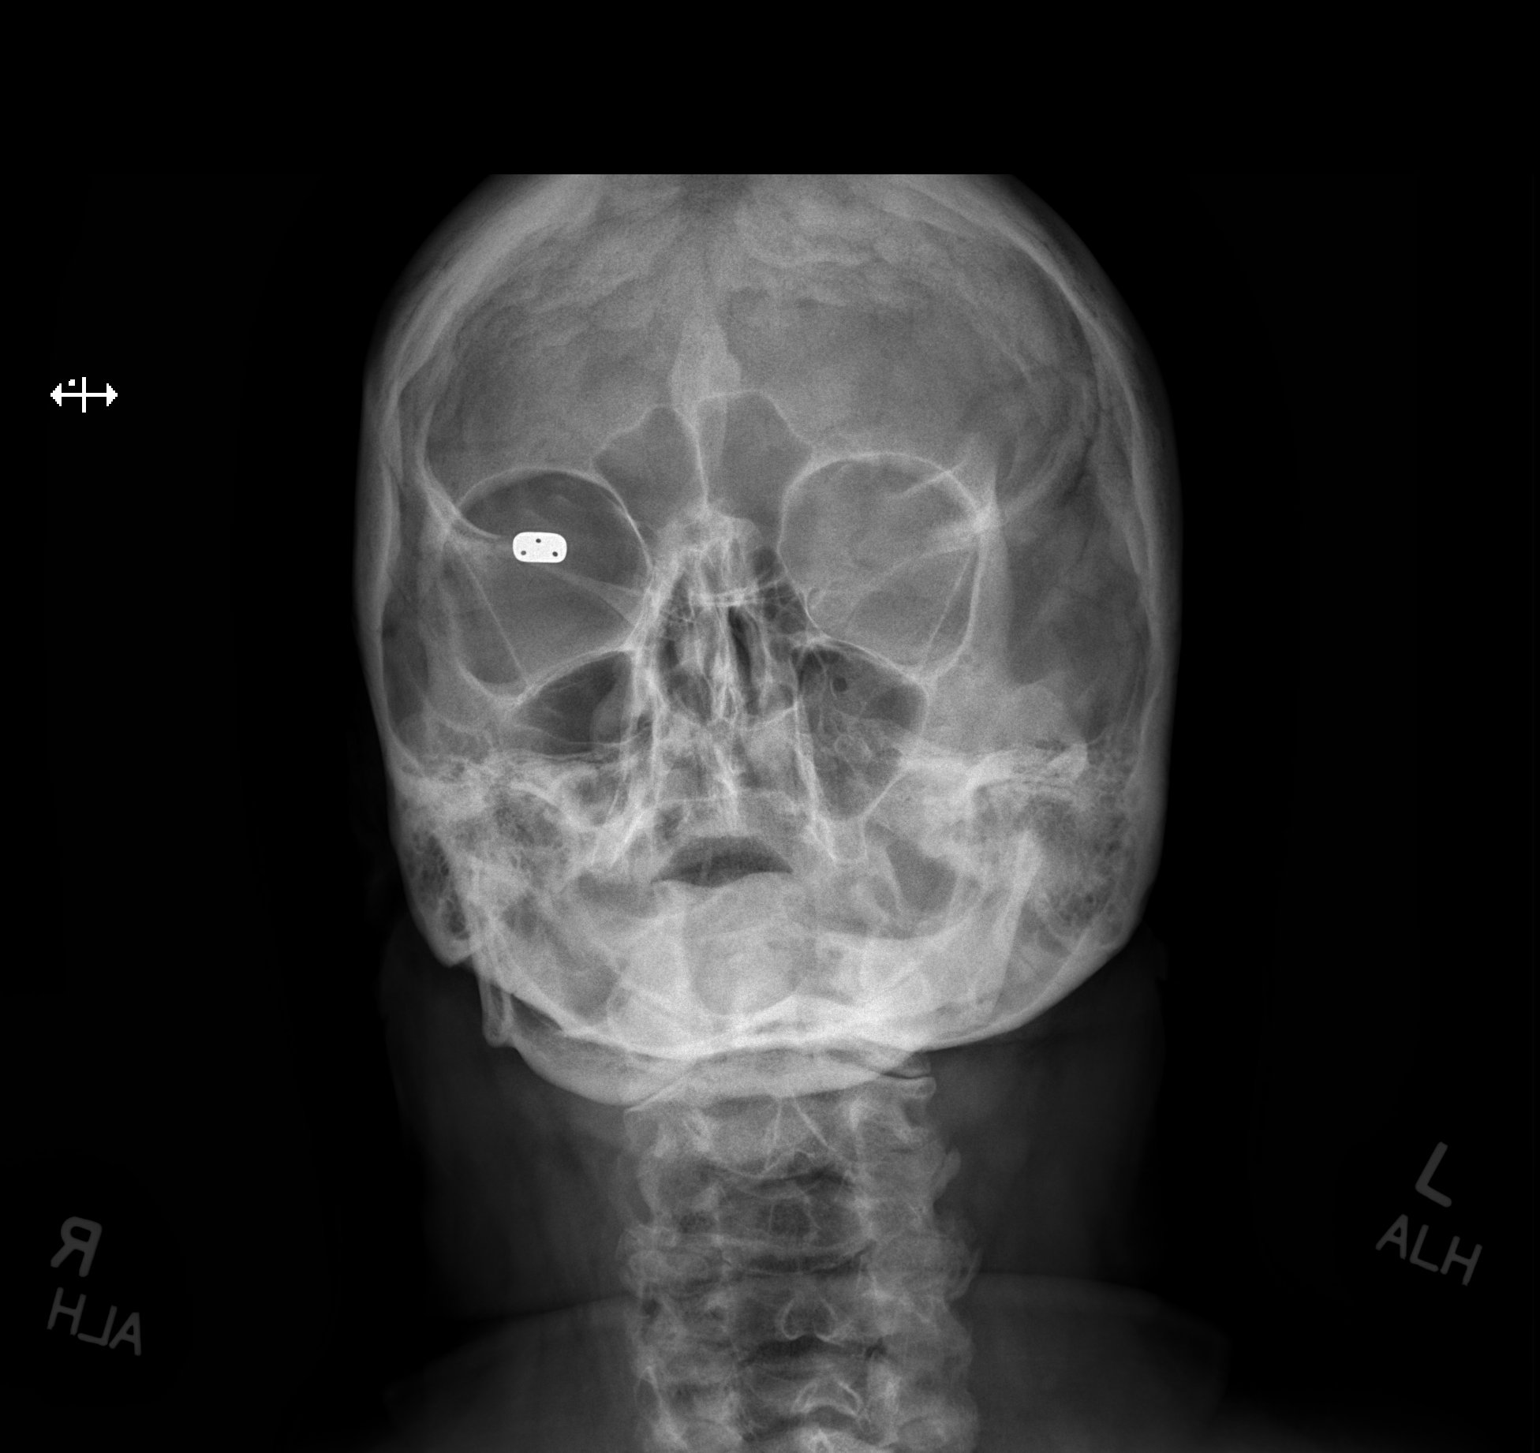

[2 of 2 positions shown; findings below may reference images not displayed]

FINDINGS: There is a 9 mm rectangular-shaped metallic foreign object seen
within the right orbit. The visualized paranasal sinuses are well
aerated.
IMPRESSION: 9 mm metallic object within the right orbit.

## 2022-12-01 IMAGING — MR MR CERVICAL SPINE W/O CM
5 series · 30 of 48 positions shown · non-contrast
Comparison: X-ray 10/10/2020

CLINICAL DATA: Neck pain with left-sided radiculopathy for 3
months.

EXAM:
MRI CERVICAL SPINE WITHOUT CONTRAST
TECHNIQUE: Multiplanar, multisequence MR imaging of the cervical spine was
performed. No intravenous contrast was administered.

[Series 5: T2 · sagittal · 3.0mm · 0.55mm/px · 6 of 15 slices shown (1 of 2)]
[im 1/15]
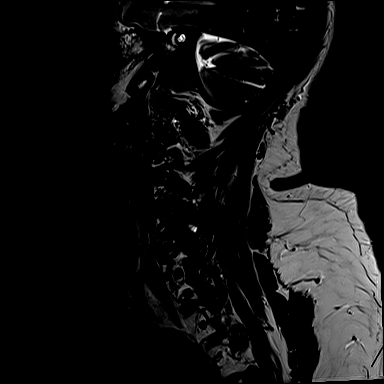
[im 3/15]
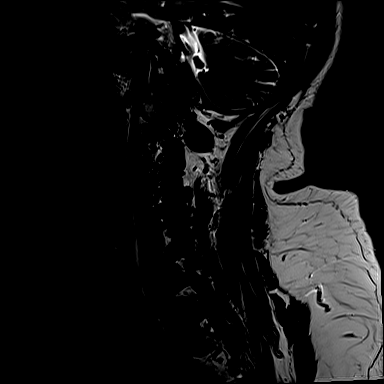
[im 6/15]
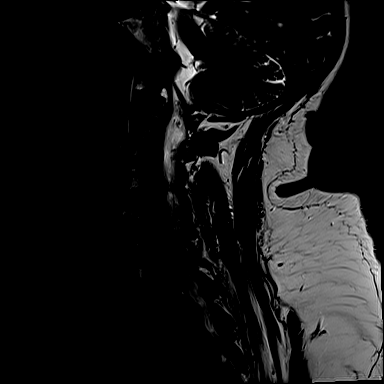
[im 9/15]
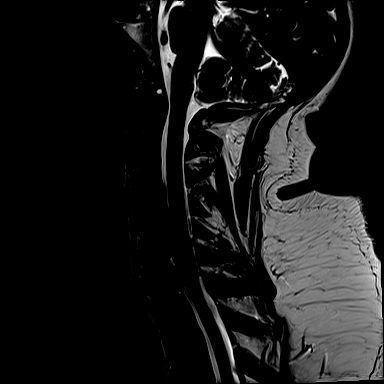
[im 12/15]
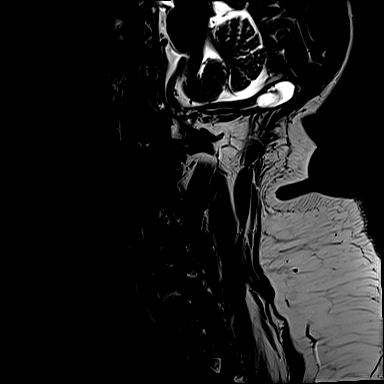
[im 15/15]
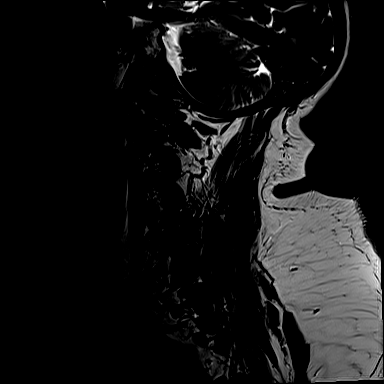

[Series 6: T1 · sagittal · 3.0mm · 0.66mm/px · 5 of 15 slices shown]
[im 1/15]
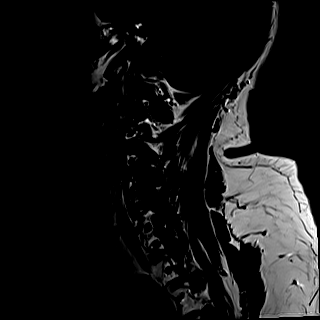
[im 4/15]
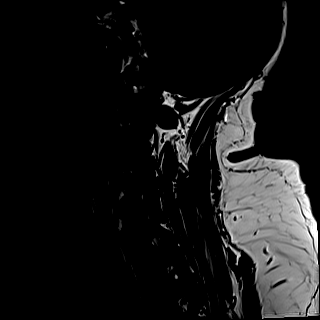
[im 8/15]
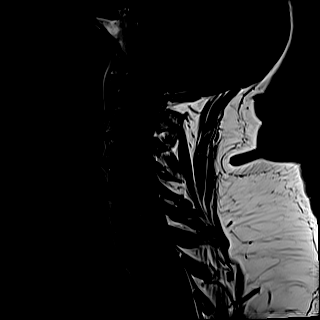
[im 11/15]
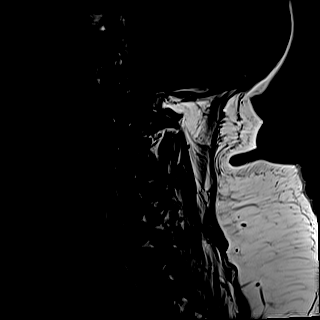
[im 15/15]
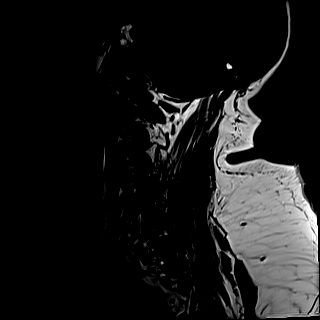

[Series 7: STIR · sagittal · 3.0mm · 0.33mm/px · 5 of 15 slices shown]
[im 1/15]
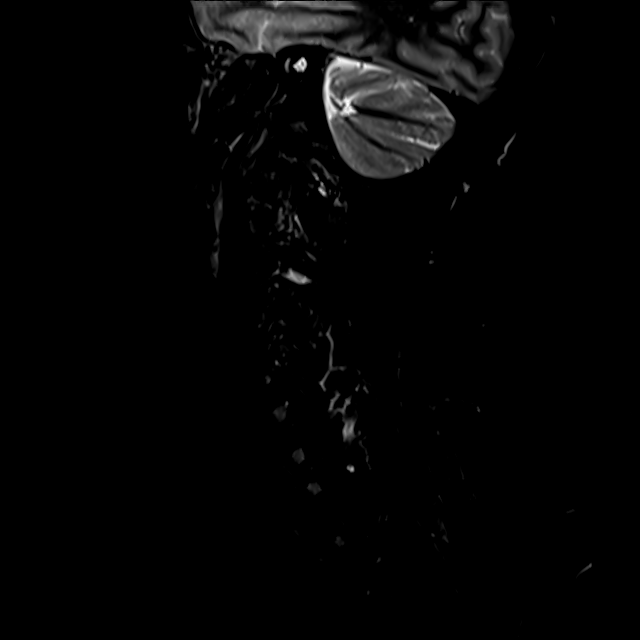
[im 4/15]
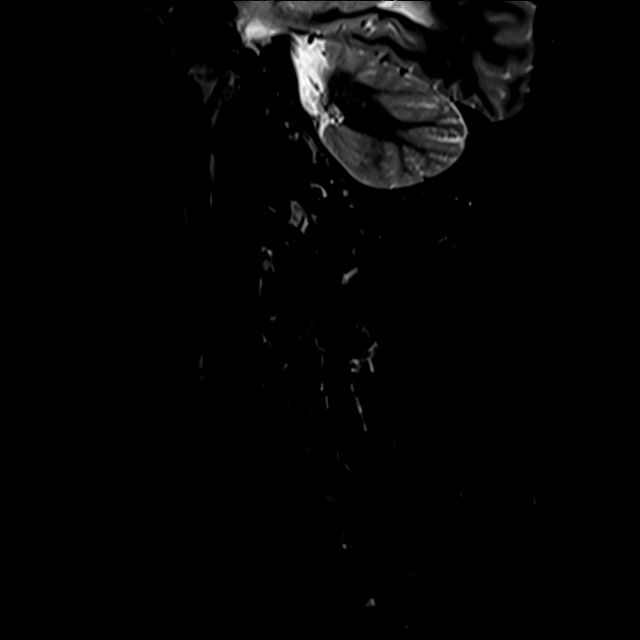
[im 8/15]
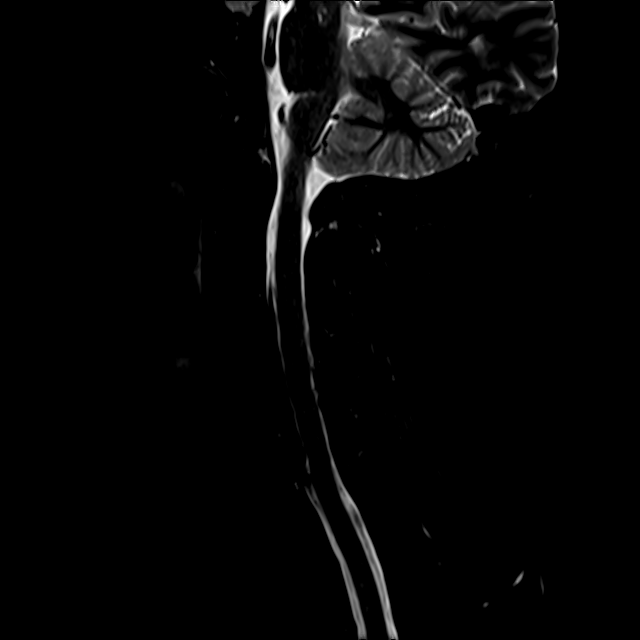
[im 11/15]
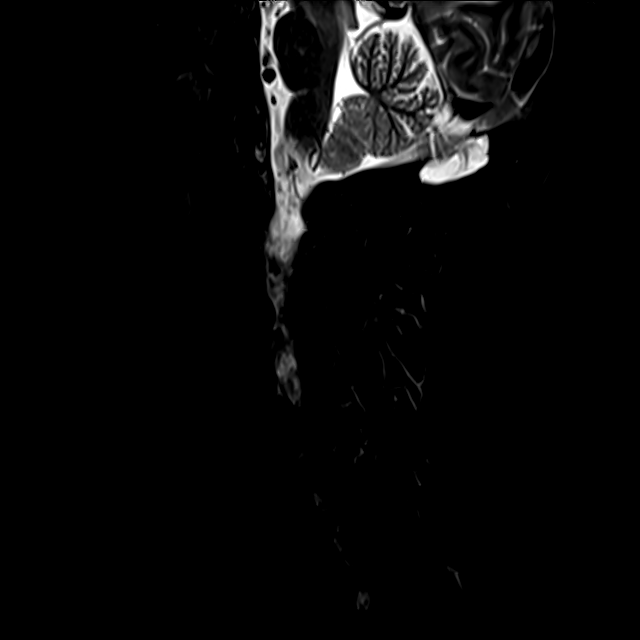
[im 15/15]
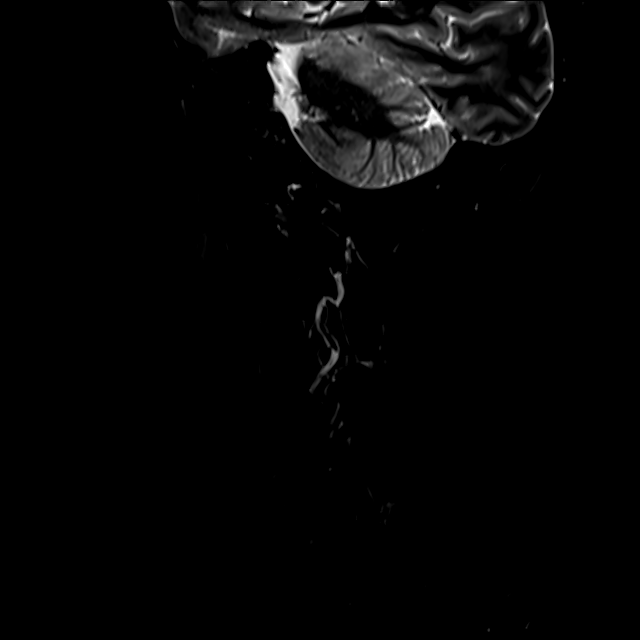

[Series 8: T2 · axial · 3.0mm · 0.62mm/px · z∈[-60,+69]mm · 10 of 44 slices shown (2 of 2)]
[im 3/44]
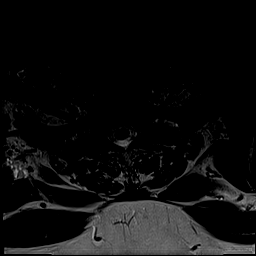
[im 6/44]
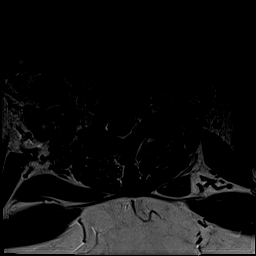
[im 9/44]
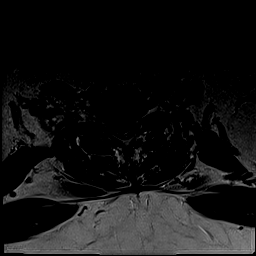
[im 15/44]
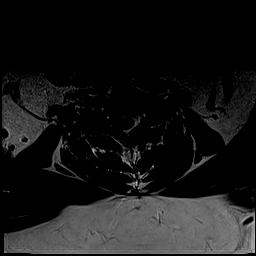
[im 21/44]
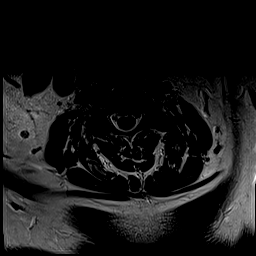
[im 23/44]
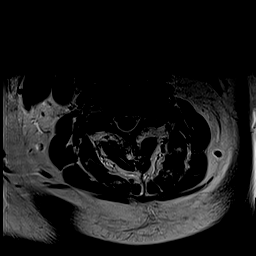
[im 26/44]
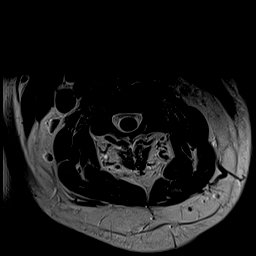
[im 32/44]
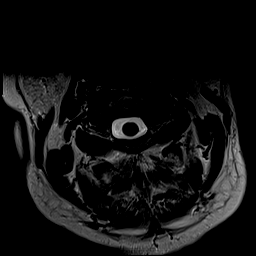
[im 38/44]
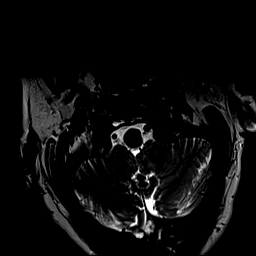
[im 44/44]
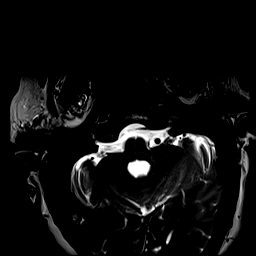

[Series 9: GRE · axial · 3.0mm · 0.42mm/px · z∈[-60,-3]mm · 4 of 44 slices shown]
[im 3/44]
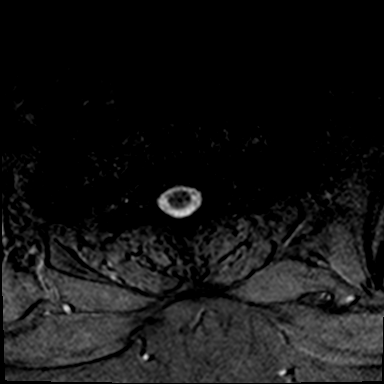
[im 9/44]
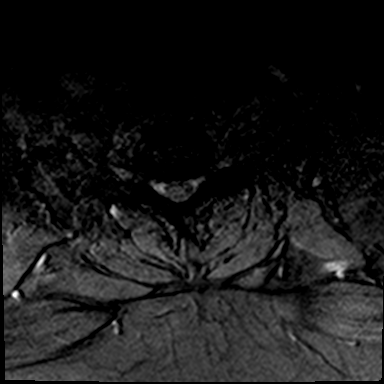
[im 15/44]
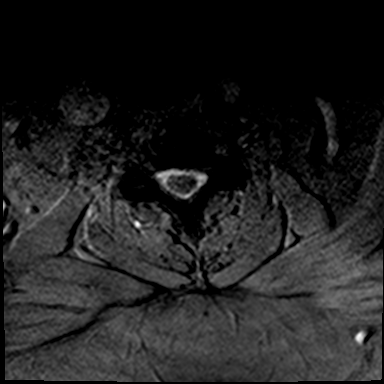
[im 21/44]
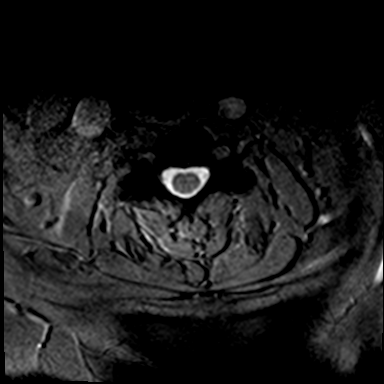

[30 of 48 positions shown; findings below may reference images not displayed]

FINDINGS: Alignment: 2 mm grade 1 anterolisthesis C3 on C4, C4 on C5, and C7
on T1.

Vertebrae: No fracture, evidence of discitis, or bone lesion.

Cord: Normal signal and morphology.

Posterior Fossa, vertebral arteries, paraspinal tissues: There is a
outpouching of CSF signal at the medial aspect of the right
occipital calvarium containing a small amount of right cerebellar
parenchyma. Collection measures 2.6 x 0.9 x 1.4 cm (series 7, images
9-13). Findings are favored to represent an intraosseous occipital
encephalocele. Vertebral artery flow voids maintained. Paraspinal
soft tissues within normal limits.

Disc levels:

C2-C3: No disc protrusion. Moderate left facet arthropathy and mild
bilateral uncovertebral spurring result in moderate left and mild
right foraminal stenosis. No canal stenosis.

C3-C4: Mild disc uncovering with bilateral uncovertebral spurring
and moderate bilateral facet arthropathy. Moderate to severe
bilateral foraminal stenosis. No canal stenosis.

C4-C5: Disc osteophyte complex with right-sided uncovertebral
spurring and right worse than left facet arthropathy. Findings
result in moderate to severe right foraminal stenosis. No
significant canal stenosis.

C5-C6: Posterior disc osteophyte complex with left paracentral disc
protrusion. Bilateral uncovertebral spurring and mild facet
arthropathy. Findings result in severe left and mild-to-moderate
right foraminal stenosis with moderate canal stenosis.

C6-C7: Posterior disc osteophyte complex results in impress upon the
cervical cord with moderate canal stenosis and mild left foraminal
stenosis.

C7-T1: No significant disc protrusion, foraminal stenosis, or canal
stenosis.
IMPRESSION: 1. Multilevel cervical spondylosis, most pronounced at the C5-6 and
C6-7 levels where there is moderate canal stenosis.
2. Multilevel bilateral foraminal stenosis, severe on the left at
C5-6 and moderate-to-severe bilaterally at C3-4 and on the right at
C4-5.
3. Small outpouching of CSF signal at the medial aspect of the right
occipital calvarium containing a small amount of right cerebellar
parenchyma, favored to represent an intraosseous occipital
encephalocele. Findings are likely incidental/congenital.

## 2022-12-17 DIAGNOSIS — I10 Essential (primary) hypertension: Secondary | ICD-10-CM | POA: Diagnosis not present

## 2022-12-17 DIAGNOSIS — K5900 Constipation, unspecified: Secondary | ICD-10-CM | POA: Diagnosis not present

## 2022-12-17 DIAGNOSIS — Z6841 Body Mass Index (BMI) 40.0 and over, adult: Secondary | ICD-10-CM | POA: Diagnosis not present

## 2022-12-17 DIAGNOSIS — G4733 Obstructive sleep apnea (adult) (pediatric): Secondary | ICD-10-CM | POA: Diagnosis not present

## 2022-12-17 DIAGNOSIS — R7303 Prediabetes: Secondary | ICD-10-CM | POA: Diagnosis not present

## 2022-12-20 DIAGNOSIS — G4733 Obstructive sleep apnea (adult) (pediatric): Secondary | ICD-10-CM | POA: Diagnosis not present

## 2023-01-01 DIAGNOSIS — G4733 Obstructive sleep apnea (adult) (pediatric): Secondary | ICD-10-CM | POA: Diagnosis not present

## 2023-01-14 DIAGNOSIS — Z1231 Encounter for screening mammogram for malignant neoplasm of breast: Secondary | ICD-10-CM | POA: Diagnosis not present

## 2023-01-14 DIAGNOSIS — H52203 Unspecified astigmatism, bilateral: Secondary | ICD-10-CM | POA: Diagnosis not present

## 2023-01-14 DIAGNOSIS — H5213 Myopia, bilateral: Secondary | ICD-10-CM | POA: Diagnosis not present

## 2023-01-14 DIAGNOSIS — Z961 Presence of intraocular lens: Secondary | ICD-10-CM | POA: Diagnosis not present

## 2023-01-14 DIAGNOSIS — H524 Presbyopia: Secondary | ICD-10-CM | POA: Diagnosis not present

## 2023-01-17 DIAGNOSIS — I10 Essential (primary) hypertension: Secondary | ICD-10-CM | POA: Diagnosis not present

## 2023-01-17 DIAGNOSIS — K5900 Constipation, unspecified: Secondary | ICD-10-CM | POA: Diagnosis not present

## 2023-01-17 DIAGNOSIS — Z6841 Body Mass Index (BMI) 40.0 and over, adult: Secondary | ICD-10-CM | POA: Diagnosis not present

## 2023-01-17 DIAGNOSIS — R7303 Prediabetes: Secondary | ICD-10-CM | POA: Diagnosis not present

## 2023-01-18 ENCOUNTER — Ambulatory Visit
Admission: EM | Admit: 2023-01-18 | Discharge: 2023-01-18 | Disposition: A | Discharge status: Home / Self Care | Payer: PPO | Attending: Family Medicine | Admitting: Family Medicine

## 2023-01-18 DIAGNOSIS — S0101XA Laceration without foreign body of scalp, initial encounter: Secondary | ICD-10-CM

## 2023-01-18 NOTE — Discharge Instructions (Addendum)
You have 5 stitches. They need to be removed in 7-10 days.  Clean the wound with soapy water or peroxide 1-2 times daily. Then put new triple antibiotic ointment  Tylenol as needed Ice the sore area

## 2023-01-18 NOTE — ED Triage Notes (Signed)
Pt tripped on comforter and hit head on wall. Laceration to top right of head. No active bleeding. Denies LOC. Reports some pain in L hand from trying to catch herself. Is able to make fist, no bruising or swelling noted.

## 2023-01-18 NOTE — ED Provider Notes (Signed)
[facility]    CSN: [id number] Arrival date & time: 01/18/23  1940      History   Chief Complaint Chief Complaint  Patient presents with   Fall   Head Laceration    HPI Crystal Potter is a 72 y.o. female.    Fall  Head Laceration   Here for cut to vertex of scalp. She tripped on a comforter this evening and hit her head on the wall. No LOC. She has some swelling of her frontal area too.  No blood thinners  Past Medical History:  Diagnosis Date   Anxiety    h/o panic attack- prior surgery for achilles    Arthritis    knees, back, hand -R    Bell's palsy 10/23/2003   DI (detrusor instability)    urgency- fr time to time    Fibroid    GERD (gastroesophageal reflux disease)    H/O hiatal hernia    History of kidney stones    Hypertension    followed by PCP, Dr. Nancy Fetter   Neuromuscular disorder [facility])    Bells Palsy - R side of face - 2005   Sleep apnea    CPAP    Patient Active Problem List   Diagnosis Date Noted   Diverticulosis of colon without hemorrhage    Grade II internal hemorrhoids    Colon cancer screening    DJD (degenerative joint disease) of knee 11/04/2013   GERD (gastroesophageal reflux disease) 07/18/2011   Bell's palsy    Fibroid    DI (detrusor instability)     Past Surgical History:  Procedure Laterality Date   ABDOMINAL HYSTERECTOMY  10/22/1993   TAH AND BLADDER NECK SUSPENSION   ACHILLES TENDON SURGERY  10/22/2010   L side    ACHILLES TENDON SURGERY Right    APPENDECTOMY     /w open cholecystectomy    BREAST REDUCTION SURGERY     REDUCTION MAMMAPLASTY   CARPAL TUNNEL RELEASE Bilateral 10/22/1986   CHOLECYSTECTOMY  10/22/1973   COLONOSCOPY WITH PROPOFOL N/A 07/17/2022   Procedure: COLONOSCOPY WITH PROPOFOL;  Surgeon: Lavena Bullion, DO;  Location: [facility];  Service: Gastroenterology;  Laterality: N/A;   EYE LID SURGERY  10/22/2004   R eye, weighted for treatment fr. bell's palsy    LASIK     2004    TOTAL KNEE ARTHROPLASTY  02/27/2012   Procedure: TOTAL KNEE ARTHROPLASTY; left Surgeon: Ninetta Lights, MD;  Location: [place];  Service: Orthopedics;  Laterality: Left;   TOTAL KNEE ARTHROPLASTY Right 11/04/2013   Procedure: RIGHT TOTAL KNEE ARTHROPLASTY;  Surgeon: Ninetta Lights, MD;  Location: [place];  Service: Orthopedics;  Laterality: Right;   TUBAL LIGATION      OB History     Gravida  1   Para  1   Term  1   Preterm      AB      Living  1      SAB      IAB      Ectopic      Multiple      Live Births               Home Medications    Prior to Admission medications   Medication Sig Start Date End Date Taking? Authorizing Provider  Ascorbic Acid (VITAMIN C) 1000 MG tablet Take 1,000 mg by mouth daily.   Yes [provider]  aspirin EC 81 MG tablet Take 81 mg  by mouth daily.   Yes [provider]  Calcium Carbonate-Vitamin D (CALCIUM + D PO) Take 2 tablets by mouth daily.   Yes [provider]  DULoxetine (CYMBALTA) 60 MG capsule Take 1 capsule (60 mg total) by mouth every morning. 08/12/12  Yes Gottsegen, Cherly Anderson, MD  esomeprazole (NEXIUM) 20 MG capsule Take 20 mg by mouth daily.   Yes [provider]  lisinopril-hydrochlorothiazide (PRINZIDE,ZESTORETIC) 20-12.5 MG per tablet Take 2 tablets by mouth daily.   Yes [provider]  loratadine (CLARITIN) 10 MG tablet Take 10 mg by mouth daily.   Yes [provider]  metFORMIN (GLUCOPHAGE) 500 MG tablet Take by mouth 2 (two) times daily with a meal.   Yes [provider]  Omega-3 Fatty Acids (FISH OIL) 1000 MG CAPS Take 1,000 mg by mouth daily.   Yes [provider]  Semaglutide (OZEMPIC, 1 MG/DOSE, [place]) Inject into the skin.   Yes [provider]  vitamin E 180 MG (400 UNITS) capsule Take 400 Units by mouth daily.   Yes [provider]  mirabegron ER (MYRBETRIQ) 50 MG TB24 tablet Take 50 mg by mouth daily.    [provider]    Family History Family History  Problem Relation Age of Onset   Bone cancer Mother    Hypertension Mother    Breast cancer Mother        Age 90   Other Sister        prediabetes   Stroke Sister    Hyperlipidemia Sister    Diabetes Maternal Grandmother    Heart attack Maternal Grandmother    Breast cancer Maternal Aunt        Age 92's   Colon cancer Maternal Uncle    Diabetes Maternal Uncle    Diabetes Paternal Aunt    Anesthesia problems Neg Hx    Colon polyps Neg Hx    Esophageal cancer Neg Hx    Stomach cancer Neg Hx    Rectal cancer Neg Hx     Social History Social History   Tobacco Use   Smoking status: Never   Smokeless tobacco: Never   Tobacco comments:    occ alcohol  Vaping Use   Vaping Use: Never used  Substance Use Topics   Alcohol use: Not Currently    Comment: rare   Drug use: No     Allergies   Patient has no known allergies.   Review of Systems Review of Systems   Physical Exam Triage Vital Signs ED Triage Vitals  Enc Vitals Group     BP 01/18/23 1954 117/77     Pulse Rate 01/18/23 1954 79     Resp 01/18/23 1954 18     Temp 01/18/23 1954 98.1 F (36.7 C)     Temp Source 01/18/23 1954 Oral     SpO2 --      Weight --      Height --      Head Circumference --      Peak Flow --      Pain Score 01/18/23 1948 8     Pain Loc --      Pain Edu? --      Excl. in [place]? --    No data found.  Updated Vital Signs BP 117/77 (BP Location: Left Arm)   Pulse 79   Temp 98.1 F (36.7 C) (Oral)   Resp 18   Visual Acuity Right Eye Distance:   Left Eye  Distance:   Bilateral Distance:    Right Eye Near:   Left Eye Near:    Bilateral Near:     Physical Exam Vitals reviewed.  Constitutional:      General: She is not in acute distress.    Appearance: She is not toxic-appearing.  HENT:     Head:     Comments: There is a 3 cm laceration that is nearly linear on the vertex of the right scalp.  It is a little jagged.   There is also a soft tissue swelling consistent with hematoma about 5 cm in diameter on her right frontal area.    Mouth/Throat:     Mouth: Mucous membranes are moist.  Eyes:     Extraocular Movements: Extraocular movements intact.     Conjunctiva/sclera: Conjunctivae normal.     Pupils: Pupils are equal, round, and reactive to light.  Cardiovascular:     Rate and Rhythm: Normal rate and regular rhythm.     Heart sounds: No murmur heard. Pulmonary:     Effort: Pulmonary effort is normal.     Breath sounds: Normal breath sounds.  Musculoskeletal:     Cervical back: Neck supple.  Lymphadenopathy:     Cervical: No cervical adenopathy.  Skin:    Coloration: Skin is not jaundiced or pale.  Neurological:     General: No focal deficit present.     Mental Status: She is alert and oriented to person, place, and time.  Psychiatric:        Behavior: Behavior normal.      UC Treatments / Results  Labs (all labs ordered are listed, but only abnormal results are displayed) Labs Reviewed - No data to display  EKG   Radiology No results found.  Procedures Procedures (including critical care time)  Medications Ordered in UC Medications - No data to display  Initial Impression / Assessment and Plan / UC Course  I have reviewed the triage vital signs and the nursing notes.  Pertinent labs & imaging results that were available during my care of the patient were reviewed by me and considered in my medical decision making (see chart for details).       After verbal consent obtained, 1% lidocaine with epinephrine is used for local anesthesia with good results.  Under clean conditions 4-0 nylon is used to proximate the edges with good results.  Hemostasis is achieved.  5 sutures were placed.  EBL is 1 mL.  There were no complications.  Antibiotic ointment is applied and wound care is explained. I have asked her to return in 7 to 10 days for suture removal.   Final Clinical  Impressions(s) / UC Diagnoses   Final diagnoses:  Laceration of scalp, initial encounter     Discharge Instructions      You have 5 stitches. They need to be removed in 7-10 days.  Clean the wound with soapy water or peroxide 1-2 times daily. Then put new triple antibiotic ointment  Tylenol as needed Ice the sore area   ED Prescriptions   None    PDMP not reviewed this encounter.   Barrett Henle, MD 01/18/23 2034

## 2023-01-20 DIAGNOSIS — G4733 Obstructive sleep apnea (adult) (pediatric): Secondary | ICD-10-CM | POA: Diagnosis not present

## 2023-01-21 DIAGNOSIS — I8312 Varicose veins of left lower extremity with inflammation: Secondary | ICD-10-CM | POA: Diagnosis not present

## 2023-01-21 DIAGNOSIS — Z85828 Personal history of other malignant neoplasm of skin: Secondary | ICD-10-CM | POA: Diagnosis not present

## 2023-01-21 DIAGNOSIS — L820 Inflamed seborrheic keratosis: Secondary | ICD-10-CM | POA: Diagnosis not present

## 2023-01-21 DIAGNOSIS — D692 Other nonthrombocytopenic purpura: Secondary | ICD-10-CM | POA: Diagnosis not present

## 2023-01-21 DIAGNOSIS — I872 Venous insufficiency (chronic) (peripheral): Secondary | ICD-10-CM | POA: Diagnosis not present

## 2023-01-21 DIAGNOSIS — L404 Guttate psoriasis: Secondary | ICD-10-CM | POA: Diagnosis not present

## 2023-01-21 DIAGNOSIS — L821 Other seborrheic keratosis: Secondary | ICD-10-CM | POA: Diagnosis not present

## 2023-01-21 DIAGNOSIS — I8311 Varicose veins of right lower extremity with inflammation: Secondary | ICD-10-CM | POA: Diagnosis not present

## 2023-01-27 ENCOUNTER — Ambulatory Visit
Admission: EM | Admit: 2023-01-27 | Discharge: 2023-01-27 | Disposition: A | Discharge status: Home / Self Care | Payer: PPO

## 2023-01-27 ENCOUNTER — Encounter: Payer: Self-pay | Admitting: Emergency Medicine

## 2023-01-27 ENCOUNTER — Other Ambulatory Visit: Payer: Self-pay

## 2023-01-27 DIAGNOSIS — Z4802 Encounter for removal of sutures: Secondary | ICD-10-CM | POA: Diagnosis not present

## 2023-01-27 NOTE — ED Triage Notes (Signed)
Pt here for suture removal from lac to head; wound healed well and 5 sutures removed

## 2023-01-28 ENCOUNTER — Ambulatory Visit: Payer: PPO

## 2023-02-11 DIAGNOSIS — Z6841 Body Mass Index (BMI) 40.0 and over, adult: Secondary | ICD-10-CM | POA: Diagnosis not present

## 2023-02-11 DIAGNOSIS — R7303 Prediabetes: Secondary | ICD-10-CM | POA: Diagnosis not present

## 2023-02-11 DIAGNOSIS — I10 Essential (primary) hypertension: Secondary | ICD-10-CM | POA: Diagnosis not present

## 2023-02-11 DIAGNOSIS — K5900 Constipation, unspecified: Secondary | ICD-10-CM | POA: Diagnosis not present

## 2023-02-19 DIAGNOSIS — G4733 Obstructive sleep apnea (adult) (pediatric): Secondary | ICD-10-CM | POA: Diagnosis not present

## 2023-02-28 DIAGNOSIS — M19011 Primary osteoarthritis, right shoulder: Secondary | ICD-10-CM | POA: Diagnosis not present

## 2023-03-01 DIAGNOSIS — F411 Generalized anxiety disorder: Secondary | ICD-10-CM | POA: Diagnosis not present

## 2023-03-01 DIAGNOSIS — I10 Essential (primary) hypertension: Secondary | ICD-10-CM | POA: Diagnosis not present

## 2023-03-01 DIAGNOSIS — Z6841 Body Mass Index (BMI) 40.0 and over, adult: Secondary | ICD-10-CM | POA: Diagnosis not present

## 2023-03-01 DIAGNOSIS — R7303 Prediabetes: Secondary | ICD-10-CM | POA: Diagnosis not present

## 2023-03-22 DIAGNOSIS — G4733 Obstructive sleep apnea (adult) (pediatric): Secondary | ICD-10-CM | POA: Diagnosis not present

## 2023-03-25 DIAGNOSIS — I10 Essential (primary) hypertension: Secondary | ICD-10-CM | POA: Diagnosis not present

## 2023-03-25 DIAGNOSIS — R7303 Prediabetes: Secondary | ICD-10-CM | POA: Diagnosis not present

## 2023-03-25 DIAGNOSIS — K5900 Constipation, unspecified: Secondary | ICD-10-CM | POA: Diagnosis not present

## 2023-03-25 DIAGNOSIS — Z6841 Body Mass Index (BMI) 40.0 and over, adult: Secondary | ICD-10-CM | POA: Diagnosis not present

## 2023-03-26 DIAGNOSIS — I10 Essential (primary) hypertension: Secondary | ICD-10-CM | POA: Diagnosis not present

## 2023-03-26 DIAGNOSIS — G4733 Obstructive sleep apnea (adult) (pediatric): Secondary | ICD-10-CM | POA: Diagnosis not present

## 2023-04-01 DIAGNOSIS — G4733 Obstructive sleep apnea (adult) (pediatric): Secondary | ICD-10-CM | POA: Diagnosis not present

## 2023-04-08 DIAGNOSIS — R300 Dysuria: Secondary | ICD-10-CM | POA: Diagnosis not present

## 2023-04-08 DIAGNOSIS — Z6841 Body Mass Index (BMI) 40.0 and over, adult: Secondary | ICD-10-CM | POA: Diagnosis not present

## 2023-04-21 DIAGNOSIS — G4733 Obstructive sleep apnea (adult) (pediatric): Secondary | ICD-10-CM | POA: Diagnosis not present

## 2023-05-14 DIAGNOSIS — M19011 Primary osteoarthritis, right shoulder: Secondary | ICD-10-CM | POA: Diagnosis not present

## 2023-05-14 DIAGNOSIS — S46011A Strain of muscle(s) and tendon(s) of the rotator cuff of right shoulder, initial encounter: Secondary | ICD-10-CM | POA: Diagnosis not present

## 2023-05-15 ENCOUNTER — Other Ambulatory Visit: Payer: Self-pay | Admitting: Family Medicine

## 2023-05-15 DIAGNOSIS — M12819 Other specific arthropathies, not elsewhere classified, unspecified shoulder: Secondary | ICD-10-CM

## 2023-05-22 DIAGNOSIS — G4733 Obstructive sleep apnea (adult) (pediatric): Secondary | ICD-10-CM | POA: Diagnosis not present

## 2023-06-04 ENCOUNTER — Other Ambulatory Visit: Payer: PPO

## 2023-06-06 ENCOUNTER — Ambulatory Visit
Admission: RE | Admit: 2023-06-06 | Discharge: 2023-06-06 | Disposition: A | Discharge status: Home / Self Care | Payer: PPO | Source: Ambulatory Visit | Attending: Family Medicine | Admitting: Family Medicine

## 2023-06-06 DIAGNOSIS — M12819 Other specific arthropathies, not elsewhere classified, unspecified shoulder: Secondary | ICD-10-CM

## 2023-06-06 DIAGNOSIS — S46011A Strain of muscle(s) and tendon(s) of the rotator cuff of right shoulder, initial encounter: Secondary | ICD-10-CM | POA: Diagnosis not present

## 2023-06-11 DIAGNOSIS — G4733 Obstructive sleep apnea (adult) (pediatric): Secondary | ICD-10-CM | POA: Diagnosis not present

## 2023-06-11 DIAGNOSIS — I10 Essential (primary) hypertension: Secondary | ICD-10-CM | POA: Diagnosis not present

## 2023-06-14 DIAGNOSIS — M19011 Primary osteoarthritis, right shoulder: Secondary | ICD-10-CM | POA: Diagnosis not present

## 2023-06-18 ENCOUNTER — Other Ambulatory Visit: Payer: Self-pay | Admitting: Orthopaedic Surgery

## 2023-06-18 DIAGNOSIS — M19011 Primary osteoarthritis, right shoulder: Secondary | ICD-10-CM | POA: Diagnosis not present

## 2023-06-18 DIAGNOSIS — M25511 Pain in right shoulder: Secondary | ICD-10-CM | POA: Diagnosis not present

## 2023-06-18 DIAGNOSIS — Z01818 Encounter for other preprocedural examination: Secondary | ICD-10-CM

## 2023-06-22 DIAGNOSIS — G4733 Obstructive sleep apnea (adult) (pediatric): Secondary | ICD-10-CM | POA: Diagnosis not present

## 2023-07-01 DIAGNOSIS — G4733 Obstructive sleep apnea (adult) (pediatric): Secondary | ICD-10-CM | POA: Diagnosis not present

## 2023-07-04 ENCOUNTER — Other Ambulatory Visit: Payer: PPO

## 2023-07-22 DIAGNOSIS — G4733 Obstructive sleep apnea (adult) (pediatric): Secondary | ICD-10-CM | POA: Diagnosis not present

## 2023-07-23 DIAGNOSIS — D1801 Hemangioma of skin and subcutaneous tissue: Secondary | ICD-10-CM | POA: Diagnosis not present

## 2023-07-23 DIAGNOSIS — Z85828 Personal history of other malignant neoplasm of skin: Secondary | ICD-10-CM | POA: Diagnosis not present

## 2023-07-23 DIAGNOSIS — L812 Freckles: Secondary | ICD-10-CM | POA: Diagnosis not present

## 2023-07-23 DIAGNOSIS — L404 Guttate psoriasis: Secondary | ICD-10-CM | POA: Diagnosis not present

## 2023-07-23 DIAGNOSIS — C44722 Squamous cell carcinoma of skin of right lower limb, including hip: Secondary | ICD-10-CM | POA: Diagnosis not present

## 2023-07-23 DIAGNOSIS — L820 Inflamed seborrheic keratosis: Secondary | ICD-10-CM | POA: Diagnosis not present

## 2023-07-23 DIAGNOSIS — D485 Neoplasm of uncertain behavior of skin: Secondary | ICD-10-CM | POA: Diagnosis not present

## 2023-07-23 DIAGNOSIS — L821 Other seborrheic keratosis: Secondary | ICD-10-CM | POA: Diagnosis not present

## 2023-07-25 ENCOUNTER — Ambulatory Visit
Admission: RE | Admit: 2023-07-25 | Discharge: 2023-07-25 | Disposition: A | Discharge status: Home / Self Care | Payer: PPO | Source: Ambulatory Visit | Attending: Orthopaedic Surgery | Admitting: Orthopaedic Surgery

## 2023-07-25 DIAGNOSIS — Z01818 Encounter for other preprocedural examination: Secondary | ICD-10-CM

## 2023-07-25 DIAGNOSIS — M25511 Pain in right shoulder: Secondary | ICD-10-CM | POA: Diagnosis not present

## 2023-07-25 DIAGNOSIS — M19011 Primary osteoarthritis, right shoulder: Secondary | ICD-10-CM | POA: Diagnosis not present

## 2023-08-12 DIAGNOSIS — I10 Essential (primary) hypertension: Secondary | ICD-10-CM | POA: Diagnosis not present

## 2023-08-12 DIAGNOSIS — G4733 Obstructive sleep apnea (adult) (pediatric): Secondary | ICD-10-CM | POA: Diagnosis not present

## 2023-08-22 DIAGNOSIS — G4733 Obstructive sleep apnea (adult) (pediatric): Secondary | ICD-10-CM | POA: Diagnosis not present

## 2023-09-21 DIAGNOSIS — G4733 Obstructive sleep apnea (adult) (pediatric): Secondary | ICD-10-CM | POA: Diagnosis not present

## 2023-09-30 DIAGNOSIS — G4733 Obstructive sleep apnea (adult) (pediatric): Secondary | ICD-10-CM | POA: Diagnosis not present

## 2023-10-01 DIAGNOSIS — M65342 Trigger finger, left ring finger: Secondary | ICD-10-CM | POA: Diagnosis not present

## 2023-10-01 DIAGNOSIS — M75101 Unspecified rotator cuff tear or rupture of right shoulder, not specified as traumatic: Secondary | ICD-10-CM | POA: Diagnosis not present

## 2023-10-01 DIAGNOSIS — R7303 Prediabetes: Secondary | ICD-10-CM | POA: Diagnosis not present

## 2023-10-01 DIAGNOSIS — F411 Generalized anxiety disorder: Secondary | ICD-10-CM | POA: Diagnosis not present

## 2023-10-01 DIAGNOSIS — N3281 Overactive bladder: Secondary | ICD-10-CM | POA: Diagnosis not present

## 2023-10-01 DIAGNOSIS — Z0000 Encounter for general adult medical examination without abnormal findings: Secondary | ICD-10-CM | POA: Diagnosis not present

## 2023-10-01 DIAGNOSIS — G4733 Obstructive sleep apnea (adult) (pediatric): Secondary | ICD-10-CM | POA: Diagnosis not present

## 2023-10-01 DIAGNOSIS — I10 Essential (primary) hypertension: Secondary | ICD-10-CM | POA: Diagnosis not present

## 2023-10-01 DIAGNOSIS — K219 Gastro-esophageal reflux disease without esophagitis: Secondary | ICD-10-CM | POA: Diagnosis not present

## 2023-10-01 DIAGNOSIS — Z23 Encounter for immunization: Secondary | ICD-10-CM | POA: Diagnosis not present

## 2023-10-01 DIAGNOSIS — Z6841 Body Mass Index (BMI) 40.0 and over, adult: Secondary | ICD-10-CM | POA: Diagnosis not present

## 2023-10-15 NOTE — H&P (Signed)
PREOPERATIVE H&P  Chief Complaint: right shoulder OA  HPI: Crystal Potter is a 72 y.o. female who is scheduled for, Procedure(s): REVERSE SHOULDER ARTHROPLASTY.   Patient has a past medical history significant for OSA, obesity, GERD, HTN. Right shoulder referral from Dr. Obie Dredge    Symptoms are rated as moderate to severe, and have been worsening.  This is significantly impairing activities of daily living.    Please see clinic note for further details on this patient's care.    She has elected for surgical management.   Past Medical History:  Diagnosis Date   Anxiety    h/o panic attack- prior surgery for achilles    Arthritis    knees, back, hand -R    Bell's palsy 10/23/2003   DI (detrusor instability)    urgency- fr time to time    Fibroid    GERD (gastroesophageal reflux disease)    H/O hiatal hernia    History of kidney stones    Hypertension    followed by PCP, Dr. Wynelle Link   Neuromuscular disorder [facility])    Bells Palsy - R side of face - 2005   Sleep apnea    CPAP   Past Surgical History:  Procedure Laterality Date   ABDOMINAL HYSTERECTOMY  10/22/1993   TAH AND BLADDER NECK SUSPENSION   ACHILLES TENDON SURGERY  10/22/2010   L side    ACHILLES TENDON SURGERY Right    APPENDECTOMY     /w open cholecystectomy    BREAST REDUCTION SURGERY     REDUCTION MAMMAPLASTY   CARPAL TUNNEL RELEASE Bilateral 10/22/1986   CHOLECYSTECTOMY  10/22/1973   COLONOSCOPY WITH PROPOFOL N/A 07/17/2022   Procedure: COLONOSCOPY WITH PROPOFOL;  Surgeon: Shellia Cleverly, DO;  Location: [facility];  Service: Gastroenterology;  Laterality: N/A;   EYE LID SURGERY  10/22/2004   R eye, weighted for treatment fr. bell's palsy    LASIK     2004   TOTAL KNEE ARTHROPLASTY  02/27/2012   Procedure: TOTAL KNEE ARTHROPLASTY; left Surgeon: Loreta Ave, MD;  Location: [facility] OR;  Service: Orthopedics;  Laterality: Left;   TOTAL KNEE ARTHROPLASTY Right 11/04/2013   Procedure: RIGHT  TOTAL KNEE ARTHROPLASTY;  Surgeon: Loreta Ave, MD;  Location: [facility] OR;  Service: Orthopedics;  Laterality: Right;   TUBAL LIGATION     Social History   Socioeconomic History   Marital status: Married    Spouse name: Not on file   Number of children: 1   Years of education: Not on file   Highest education level: Not on file  Occupational History   Occupation: retired  Tobacco Use   Smoking status: Never   Smokeless tobacco: Never   Tobacco comments:    occ alcohol  Vaping Use   Vaping status: Never Used  Substance and Sexual Activity   Alcohol use: Not Currently    Comment: rare   Drug use: No   Sexual activity: Yes    Birth control/protection: Surgical  Other Topics Concern   Not on file  Social History Narrative   Not on file   Social Drivers of Health   Financial Resource Strain: Not on file  Food Insecurity: Not on file  Transportation Needs: Not on file  Physical Activity: Not on file  Stress: Not on file  Social Connections: Not on file   Family History  Problem Relation Age of Onset   Bone cancer Mother    Hypertension Mother    Breast  cancer Mother        Age 20   Other Sister        prediabetes   Stroke Sister    Hyperlipidemia Sister    Diabetes Maternal Grandmother    Heart attack Maternal Grandmother    Breast cancer Maternal Aunt        Age 73's   Colon cancer Maternal Uncle    Diabetes Maternal Uncle    Diabetes Paternal Aunt    Anesthesia problems Neg Hx    Colon polyps Neg Hx    Esophageal cancer Neg Hx    Stomach cancer Neg Hx    Rectal cancer Neg Hx    No Known Allergies Prior to Admission medications   Medication Sig Start Date End Date Taking? Authorizing Provider  Ascorbic Acid (VITAMIN C) 1000 MG tablet Take 1,000 mg by mouth daily.    [provider]  aspirin EC 81 MG tablet Take 81 mg by mouth daily.    [provider]  Calcium Carbonate-Vitamin D (CALCIUM + D PO) Take 2 tablets by mouth daily.     [provider]  DULoxetine (CYMBALTA) 60 MG capsule Take 1 capsule (60 mg total) by mouth every morning. 08/12/12   Trellis Paganini, MD  esomeprazole (NEXIUM) 20 MG capsule Take 20 mg by mouth daily.    [provider]  lisinopril-hydrochlorothiazide (PRINZIDE,ZESTORETIC) 20-12.5 MG per tablet Take 2 tablets by mouth daily.    [provider]  loratadine (CLARITIN) 10 MG tablet Take 10 mg by mouth daily.    [provider]  metFORMIN (GLUCOPHAGE) 500 MG tablet Take by mouth 2 (two) times daily with a meal.    [provider]  mirabegron ER (MYRBETRIQ) 50 MG TB24 tablet Take 50 mg by mouth daily.    [provider]  Omega-3 Fatty Acids (FISH OIL) 1000 MG CAPS Take 1,000 mg by mouth daily.    [provider]  Semaglutide (OZEMPIC, 1 MG/DOSE, Kermit) Inject into the skin.    [provider]  vitamin E 180 MG (400 UNITS) capsule Take 400 Units by mouth daily.    [provider]    ROS: All other systems have been reviewed and were otherwise negative with the exception of those mentioned in the HPI and as above.  Physical Exam: General: Alert, no acute distress Cardiovascular: No pedal edema Respiratory: No cyanosis, no use of accessory musculature GI: No organomegaly, abdomen is soft and non-tender Skin: No lesions in the area of chief complaint Neurologic: Sensation intact distally Psychiatric: Patient is competent for consent with normal mood and affect Lymphatic: No axillary or cervical lymphadenopathy  MUSCULOSKELETAL:  On exam, active forward elevation to 110; passive to 130; external rotation to 20; internal rotation to front pocket.  Cuff strength is weak with supraspinatus and infraspinatus testing.  Imaging: MRI demonstrates some high grade partial thickness tearing of the supraspinatus.  There is end stage arthritis of the glenohumeral joint with severe lytic changes in her glenoid and bony edema.  She  also has a large inferior osteophyte on the humeral head. These x-rays were reviewed by me.   Assessment: right shoulder OA  Plan: Plan for Procedure(s): REVERSE SHOULDER ARTHROPLASTY   The risks benefits and alternatives were discussed with the patient including but not limited to the risks of nonoperative treatment, versus surgical intervention including infection, bleeding, nerve injury,  blood clots, cardiopulmonary complications, morbidity, mortality, among others, and they were willing to proceed.  The patient acknowledged the explanation, agreed to proceed with the plan and consent was signed.   Operative Plan: Right reverse total shoulder arthroplasty  Discharge Medications: Oxycodone, celebrex, zofran, tylenol, protonix DVT Prophylaxis: ASA 81mg  BID x 6 weeks Physical Therapy: outpatient Special Discharge needs: Sling, Gennaro Africa, PA-C  10/15/2023 8:09 AM

## 2023-10-22 DIAGNOSIS — G4733 Obstructive sleep apnea (adult) (pediatric): Secondary | ICD-10-CM | POA: Diagnosis not present

## 2023-10-25 NOTE — Progress Notes (Signed)
 Sent message, via epic in basket, requesting orders in epic from Careers adviser.

## 2023-10-31 NOTE — Patient Instructions (Addendum)
 SURGICAL WAITING ROOM VISITATION Patients having surgery or a procedure may have no more than 2 support people in the waiting area - these visitors may rotate.    Children under the age of 7 must have an adult with them who is not the patient.  If the patient needs to stay at the hospital during part of their recovery, the visitor guidelines for inpatient rooms apply. Pre-op nurse will coordinate an appropriate time for 1 support person to accompany patient in pre-op.  This support person may not rotate.    Please refer to the [facility] website for the visitor guidelines for Inpatients (after your surgery is over and you are in a regular room).       Your procedure is scheduled on: 11-13-23   Report to [facility] Main Entrance    Report to admitting at 5:15 AM   Call this number if you have problems the morning of surgery [phone]   Do not eat food or drink liquids :After Midnight.          If you have questions, please contact your surgeon's office.   FOLLOW  ANY ADDITIONAL PRE OP INSTRUCTIONS YOU RECEIVED FROM YOUR SURGEON'S OFFICE!!!     Oral Hygiene is also important to reduce your risk of infection.                                    Remember - BRUSH YOUR TEETH THE MORNING OF SURGERY WITH YOUR REGULAR TOOTHPASTE   Do NOT smoke after Midnight   Take these medicines the morning of surgery with A SIP OF WATER :   Duloxetine   Esomeprazole  Claritin   Okay to use inhalers  Stop all vitamins and herbal supplements 7 days before surgery  Bring CPAP mask and tubing day of surgery.                              You may not have any metal on your body including hair pins, jewelry, and body piercing             Do not wear make-up, lotions, powders, perfumes or deodorant  Do not wear nail polish including gel and S&S, artificial/acrylic nails, or any other type of covering on natural nails including finger and toenails. If you have artificial nails, gel coating,  etc. that needs to be removed by a nail salon please have this removed prior to surgery or surgery may need to be canceled/ delayed if the surgeon/ anesthesia feels like they are unable to be safely monitored.   Do not shave  48 hours prior to surgery.   Do not bring valuables to the hospital. [facility] IS NOT RESPONSIBLE   FOR VALUABLES.   Contacts, dentures or bridgework may not be worn into surgery.  DO NOT BRING YOUR HOME MEDICATIONS TO THE HOSPITAL. PHARMACY WILL DISPENSE MEDICATIONS LISTED ON YOUR MEDICATION LIST TO YOU DURING YOUR ADMISSION IN THE HOSPITAL!    Patients discharged on the day of surgery will not be allowed to drive home.  Someone NEEDS to stay with you for the first 24 hours after anesthesia.   Special Instructions: Bring a copy of your healthcare power of attorney and living will documents the day of surgery if you haven't scanned them before.              Please  read over the following fact sheets you were given: IF YOU HAVE QUESTIONS ABOUT YOUR PRE-OP INSTRUCTIONS PLEASE CALL [phone] Gwen  If you received a COVID test during your pre-op visit  it is requested that you wear a mask when out in public, stay away from anyone that may not be feeling well and notify your surgeon if you develop symptoms. If you test positive for Covid or have been in contact with anyone that has tested positive in the last 10 days please notify you surgeon.   [facility]- Preparing for Total Shoulder Arthroplasty    Before surgery, you can play an important role. Because skin is not sterile, your skin needs to be as free of germs as possible. You can reduce the number of germs on your skin by using the following products. Benzoyl Peroxide Gel Reduces the number of germs present on the skin Applied twice a day to shoulder area starting two days before surgery    ==================================================================  Please follow these instructions  carefully:  BENZOYL PEROXIDE 5% GEL  Please do not use if you have an allergy to benzoyl peroxide.   If your skin becomes reddened/irritated stop using the benzoyl peroxide.  Starting two days before surgery, apply as follows: Apply benzoyl peroxide in the morning and at night. Apply after taking a shower. If you are not taking a shower clean entire shoulder front, back, and side along with the armpit with a clean wet washcloth.  Place a quarter-sized dollop on your shoulder and rub in thoroughly, making sure to cover the front, back, and side of your shoulder, along with the armpit.   2 days before ____ AM   ____ PM              1 day before ____ AM   ____ PM                         Do this twice a day for two days.  (Last application is the night before surgery, AFTER using the CHG soap as described below).  Do NOT apply benzoyl peroxide gel on the day of surgery.    Pre-operative 5 CHG Bath Instructions   You can play a key role in reducing the risk of infection after surgery. Your skin needs to be as free of germs as possible. You can reduce the number of germs on your skin by washing with CHG (chlorhexidine  gluconate) soap before surgery. CHG is an antiseptic soap that kills germs and continues to kill germs even after washing.   DO NOT use if you have an allergy to chlorhexidine /CHG or antibacterial soaps. If your skin becomes reddened or irritated, stop using the CHG and notify one of our RNs at [phone].   Please shower with the CHG soap starting 4 days before surgery using the following schedule:     Please keep in mind the following:  DO NOT shave, including legs and underarms, starting the day of your first shower.   You may shave your face at any point before/day of surgery.  Place clean sheets on your bed the day you start using CHG soap. Use a clean washcloth (not used since being washed) for each shower. DO NOT sleep with pets once you start using the CHG.   CHG  Shower Instructions:  If you choose to wash your hair and private area, wash first with your normal shampoo/soap.  After you use shampoo/soap, rinse your hair and  body thoroughly to remove shampoo/soap residue.  Turn the water  OFF and apply about 3 tablespoons (45 ml) of CHG soap to a CLEAN washcloth.  Apply CHG soap ONLY FROM YOUR NECK DOWN TO YOUR TOES (washing for 3-5 minutes)  DO NOT use CHG soap on face, private areas, open wounds, or sores.  Pay special attention to the area where your surgery is being performed.  If you are having back surgery, having someone wash your back for you may be helpful. Wait 2 minutes after CHG soap is applied, then you may rinse off the CHG soap.  Pat dry with a clean towel  Put on clean clothes/pajamas   If you choose to wear lotion, please use ONLY the CHG-compatible lotions on the back of this paper.     Additional instructions for the day of surgery: DO NOT APPLY any lotions, deodorants, cologne, or perfumes.   Put on clean/comfortable clothes.  Brush your teeth.  Ask your nurse before applying any prescription medications to the skin.      CHG Compatible Lotions   Aveeno Moisturizing lotion  Cetaphil Moisturizing Cream  Cetaphil Moisturizing Lotion  Clairol Herbal Essence Moisturizing Lotion, Dry Skin  Clairol Herbal Essence Moisturizing Lotion, Extra Dry Skin  Clairol Herbal Essence Moisturizing Lotion, Normal Skin  Curel Age Defying Therapeutic Moisturizing Lotion with Alpha Hydroxy  Curel Extreme Care Body Lotion  Curel Soothing Hands Moisturizing Hand Lotion  Curel Therapeutic Moisturizing Cream, Fragrance-Free  Curel Therapeutic Moisturizing Lotion, Fragrance-Free  Curel Therapeutic Moisturizing Lotion, Original Formula  Eucerin Daily Replenishing Lotion  Eucerin Dry Skin Therapy Plus Alpha Hydroxy Crme  Eucerin Dry Skin Therapy Plus Alpha Hydroxy Lotion  Eucerin Original Crme  Eucerin Original Lotion  Eucerin Plus Crme  Eucerin Plus Lotion  Eucerin TriLipid Replenishing Lotion  Keri Anti-Bacterial Hand Lotion  Keri Deep Conditioning Original Lotion Dry Skin Formula Softly Scented  Keri Deep Conditioning Original Lotion, Fragrance Free Sensitive Skin Formula  Keri Lotion Fast Absorbing Fragrance Free Sensitive Skin Formula  Keri Lotion Fast Absorbing Softly Scented Dry Skin Formula  Keri Original Lotion  Keri Skin Renewal Lotion Keri Silky Smooth Lotion  Keri Silky Smooth Sensitive Skin Lotion  Nivea Body Creamy Conditioning Oil  Nivea Body Extra Enriched Lotion  Nivea Body Original Lotion  Nivea Body Sheer Moisturizing Lotion Nivea Crme  Nivea Skin Firming Lotion  NutraDerm 30 Skin Lotion  NutraDerm Skin Lotion  NutraDerm Therapeutic Skin Cream  NutraDerm Therapeutic Skin Lotion  ProShield Protective Hand Cream  Provon moisturizing lotion   PATIENT SIGNATURE_________________________________  NURSE SIGNATURE__________________________________  ________________________________________________________________________    Nasario Exon  An incentive spirometer is a tool that can help keep your lungs clear and active. This tool measures how well you are filling your lungs with each breath. Taking long deep breaths may help reverse or decrease the chance of developing breathing (pulmonary) problems (especially infection) following: A long period of time when you are unable to move or be active. BEFORE THE PROCEDURE  If the spirometer includes an indicator to show your best effort, your nurse or respiratory therapist will set it to a desired goal. If possible, sit up straight or lean slightly forward. Try not to slouch. Hold the incentive spirometer in an upright position. INSTRUCTIONS FOR USE  Sit on the edge of your bed if possible, or sit up as far as you can in bed or on a chair. Hold the incentive spirometer in an upright position. Breathe out normally. Place the mouthpiece in your mouth  and seal your lips tightly around it. Breathe in slowly and as deeply as possible, raising the piston or the ball toward the top of the column. Hold your breath for 3-5 seconds or for as long as possible. Allow the piston or ball to fall to the bottom of the column. Remove the mouthpiece from your mouth and breathe out normally. Rest for a few seconds and repeat Steps 1 through 7 at least 10 times every 1-2 hours when you are awake. Take your time and take a few normal breaths between deep breaths. The spirometer may include an indicator to show your best effort. Use the indicator as a goal to work toward during each repetition. After each set of 10 deep breaths, practice coughing to be sure your lungs are clear. If you have an incision (the cut made at the time of surgery), support your incision when coughing by placing a pillow or rolled up towels firmly against it. Once you are able to get out of bed, walk around indoors and cough well. You may stop using the incentive spirometer when instructed by your caregiver.  RISKS AND COMPLICATIONS Take your time so you do not get dizzy or light-headed. If you are in pain, you may need to take or ask for pain medication before doing incentive spirometry. It is harder to take a deep breath if you are having pain. AFTER USE Rest and breathe slowly and easily. It can be helpful to keep track of a log of your progress. Your caregiver can provide you with a simple table to help with this. If you are using the spirometer at home, follow these instructions: SEEK MEDICAL CARE IF:  You are having difficultly using the spirometer. You have trouble using the spirometer as often as instructed. Your pain medication is not giving enough relief while using the spirometer. You develop fever of 100.5 F (38.1 C) or higher. SEEK IMMEDIATE MEDICAL CARE IF:  You cough up bloody sputum that had not been present before. You develop fever of 102 F (38.9 C) or  greater. You develop worsening pain at or near the incision site. MAKE SURE YOU:  Understand these instructions. Will watch your condition. Will get help right away if you are not doing well or get worse. Document Released: 02/18/2007 Document Revised: 12/31/2011 Document Reviewed: 04/21/2007 [facility] Patient Information 2014 [place], [place].   ________________________________________________________________________

## 2023-10-31 NOTE — Progress Notes (Addendum)
 COVID Vaccine Completed:  Date of COVID positive in last 90 days:  No  PCP - Vyvyan Sun, MD (office note on chart) Cardiologist - N/A  Chest x-ray -  N/A EKG - 11-01-23 Epic Stress Test -  N/A ECHO -  N/A Cardiac Cath -  N/A Pacemaker/ICD device last checked: Spinal Cord Stimulator: N/A  Bowel Prep -  N/A  Sleep Study - Yes, +sleep apnea CPAP - Yes  Prediabetes Fasting Blood Sugar -  Checks Blood Sugar _____ times a day  Last dose of GLP1 agonist-  N/A GLP1 instructions:  Hold 7 days before surgery    Last dose of SGLT-2 inhibitors-  N/A SGLT-2 instructions:  Hold 3 days before surgery   Blood Thinner Instructions:  Time Aspirin  Instructions:  ASA 81 Last Dose:  Patient will check to see if she needs to stop  Activity level:  Can go up a flight of stairs and perform activities of daily living without symptoms of chest pain.  Patient states that she does have shortness of breath with climbing stairs and has to stop and rest.  This has been an issue for many years she thinks due to her weight.  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 Patient was also instructed that they will need to review over the PAT instructions again at home before surgery.

## 2023-11-01 ENCOUNTER — Encounter (HOSPITAL_COMMUNITY)
Admission: RE | Admit: 2023-11-01 | Discharge: 2023-11-01 | Disposition: A | Discharge status: Home / Self Care | Payer: PPO | Source: Ambulatory Visit | Attending: Orthopaedic Surgery | Admitting: Orthopaedic Surgery

## 2023-11-01 ENCOUNTER — Other Ambulatory Visit: Payer: Self-pay

## 2023-11-01 ENCOUNTER — Encounter (HOSPITAL_COMMUNITY): Payer: Self-pay

## 2023-11-01 VITALS — BP 157/71 | HR 78 | Temp 98.0°F | Resp 20 | Ht 66.0 in | Wt 340.0 lb

## 2023-11-01 DIAGNOSIS — Z01818 Encounter for other preprocedural examination: Secondary | ICD-10-CM | POA: Diagnosis not present

## 2023-11-01 DIAGNOSIS — Z01810 Encounter for preprocedural cardiovascular examination: Secondary | ICD-10-CM | POA: Diagnosis present

## 2023-11-01 DIAGNOSIS — Z01812 Encounter for preprocedural laboratory examination: Secondary | ICD-10-CM | POA: Diagnosis present

## 2023-11-01 DIAGNOSIS — I10 Essential (primary) hypertension: Secondary | ICD-10-CM | POA: Insufficient documentation

## 2023-11-01 HISTORY — DX: Prediabetes: R73.03

## 2023-11-01 LAB — BASIC METABOLIC PANEL
Anion gap: 8 (ref 5–15)
BUN: 16 mg/dL (ref 8–23)
CO2: 26 mmol/L (ref 22–32)
Calcium: 9.2 mg/dL (ref 8.9–10.3)
Chloride: 99 mmol/L (ref 98–111)
Creatinine, Ser: 0.6 mg/dL (ref 0.44–1.00)
GFR, Estimated: >60 mL/min (ref >60)
Glucose, Bld: 113 mg/dL — ABNORMAL HIGH (ref 70–99)
Potassium: 4.2 mmol/L (ref 3.5–5.1)
Sodium: 133 mmol/L — ABNORMAL LOW (ref 135–145)

## 2023-11-01 LAB — CBC
HCT: 41.4 % (ref 36.0–46.0)
Hemoglobin: 14.2 g/dL (ref 12.0–15.0)
MCH: 31.6 pg (ref 26.0–34.0)
MCHC: 34.3 g/dL (ref 30.0–36.0)
MCV: 92.2 fL (ref 80.0–100.0)
Platelets: 459 10*3/uL — ABNORMAL HIGH (ref 150–400)
RBC: 4.49 MIL/uL (ref 3.87–5.11)
RDW: 13 % (ref 11.5–15.5)
WBC: 7.8 10*3/uL (ref 4.0–10.5)
nRBC: 0.8 % — ABNORMAL HIGH (ref 0.0–0.2)

## 2023-11-01 LAB — SURGICAL PCR SCREEN
MRSA, PCR: NEGATIVE
Staphylococcus aureus: NEGATIVE

## 2023-11-08 NOTE — Care Plan (Signed)
Ortho Bundle Case Management Note  Patient Details  Name: Crystal Potter MRN: [id number] Date of Birth: 06-18-1951    spoke with patient. will discharge to home with family to assist. no DME needed. OPPT set up with [employer]. discharge instructions mailed and discussed. Patient and MD in agreement with plan. Choice offered              DME Arranged:    DME Agency:     HH Arranged:    HH Agency:     Additional Comments: Please contact me with any questions of if this plan should need to change.  Shauna Hugh,  RN,BSN,MHA,CCM  [facility] Orthopaedic Specialist  [phone] 11/08/2023, 8:36 AM

## 2023-11-12 ENCOUNTER — Encounter (HOSPITAL_COMMUNITY): Payer: Self-pay | Admitting: Orthopaedic Surgery

## 2023-11-12 NOTE — Anesthesia Preprocedure Evaluation (Addendum)
Anesthesia Evaluation  Patient identified by MRN, date of birth, ID band Patient awake    Reviewed: Allergy & Precautions, NPO status , Patient's Chart, lab work & pertinent test results, reviewed documented beta blocker date and time   Airway Mallampati: II  TM Distance: >3 FB     Dental  (+) Lower Dentures, Upper Dentures   Pulmonary sleep apnea and Continuous Positive Airway Pressure Ventilation    breath sounds clear to auscultation       Cardiovascular hypertension, Pt. on medications Normal cardiovascular exam Rhythm:Regular Rate:Normal     Neuro/Psych   Anxiety      Neuromuscular disease    GI/Hepatic hiatal hernia,GERD  Medicated,,Elevated LFT's   Endo/Other    Class 4 obesityHyperlipidemia  Renal/GU negative Renal ROS  negative genitourinary   Musculoskeletal  (+) Arthritis , Osteoarthritis,  OA right shoulder   Abdominal  (+) + obese  Peds  Hematology negative hematology ROS (+)   Anesthesia Other Findings   Reproductive/Obstetrics                              Anesthesia Physical Anesthesia Plan  ASA: 3  Anesthesia Plan: General   Post-op Pain Management: Regional block*, Minimal or no pain anticipated and Precedex   Induction:   PONV Risk Score and Plan: 4 or greater and Treatment may vary due to age or medical condition, Ondansetron and Dexamethasone  Airway Management Planned: Oral ETT  Additional Equipment: None  Intra-op Plan:   Post-operative Plan: Extubation in OR  Informed Consent: I have reviewed the patients History and Physical, chart, labs and discussed the procedure including the risks, benefits and alternatives for the proposed anesthesia with the patient or authorized representative who has indicated his/her understanding and acceptance.       Plan Discussed with: CRNA and Anesthesiologist  Anesthesia Plan Comments:          Anesthesia  Quick Evaluation

## 2023-11-12 NOTE — Discharge Instructions (Signed)
Ramond Marrow MD, MPH Alfonse Alpers, PA-C [facility] [address], [address] [phone] (tel)   [phone] (fax)   POST-OPERATIVE INSTRUCTIONS - TOTAL SHOULDER REPLACEMENT    WOUND CARE You may leave the operative dressing in place until your follow-up appointment. KEEP THE INCISIONS CLEAN AND DRY. There may be a small amount of fluid/bleeding leaking at the surgical site. This is normal after surgery.  If it fills with liquid or blood please call us immediately to change it for you. Use the provided ice machine or Ice packs as often as possible for the first 3-4 days, then as needed for pain relief.   Keep a layer of cloth or a shirt between your skin and the cooling unit to prevent frost bite as it can get very cold.  SHOWERING: - You may shower on Post-Op Day #2.  - The dressing is water resistant but do not scrub it as it may start to peel up.   - You may remove the sling for showering - Gently pat the area dry.  - Do not soak the shoulder in water.  - Do not go swimming in the pool or ocean until your incision has completely healed (about 4-6 weeks after surgery) - KEEP THE INCISIONS CLEAN AND DRY.  EXERCISES Wear the sling at all times  You may remove the sling for showering, but keep the arm across the chest or in a secondary sling.    Accidental/Purposeful External Rotation and shoulder flexion (reaching behind you) is to be avoided at all costs for the first month. It is ok to come out of your sling if your are sitting and have assistance for eating.   Do not lift anything heavier than 1 pound until we discuss it further in clinic.  It is normal for your fingers/hand to become more swollen after surgery and discolored from bruising.   This will resolve over the first few weeks usually after surgery. Please continue to ambulate and do not stay sitting or lying for too long.  Perform foot and wrist pumps to assist in circulation.  PHYSICAL  THERAPY - You will begin physical therapy soon after surgery (unless otherwise specified) - Please call to set up an appointment, if you do not already have one  - Let our office if there are any issues with scheduling your therapy  - You have a physical therapy appointment scheduled at [facility] (across the hall from our office) on 1/24   REGIONAL ANESTHESIA (NERVE BLOCKS) The anesthesia team may have performed a nerve block for you this is a great tool used to minimize pain.   The block may start wearing off overnight (between 8-24 hours postop) When the block wears off, your pain may go from nearly zero to the pain you would have had postop without the block. This is an abrupt transition but nothing dangerous is happening.   This can be a challenging period but utilize your as needed pain medications to try and manage this period. We suggest you use the pain medication the first night prior to going to bed, to ease this transition.  You may take an extra dose of narcotic when this happens if needed   POST-OP MEDICATIONS- Multimodal approach to pain control In general your pain will be controlled with a combination of substances.  Prescriptions unless otherwise discussed are electronically sent to your pharmacy.  This is a carefully made plan we use to minimize narcotic use.  Celebrex - Anti-inflammatory medication taken on a scheduled basis Acetaminophen - Non-narcotic pain medicine taken on a scheduled basis  Oxycodone - This is a strong narcotic, to be used only on an "as needed" basis for SEVERE pain. Aspirin 81mg  - This medicine is used to minimize the risk of blood clots after surgery.  Zofran -  take as needed for nausea  FOLLOW-UP If you develop a Fever (>101.5), Redness or Drainage from the surgical incision site, please call our office to arrange for an evaluation. Please call the office to schedule a follow-up appointment for a wound check, 7-10 days post-operatively.  IF  YOU HAVE ANY QUESTIONS, PLEASE FEEL FREE TO CALL OUR OFFICE.  HELPFUL INFORMATION  Your arm will be in a sling following surgery. You will be in this sling for the next 4 weeks.   You may be more comfortable sleeping in a semi-seated position the first few nights following surgery.  Keep a pillow propped under the elbow and forearm for comfort.  If you have a recliner type of chair it might be beneficial.  If not that is fine too, but it would be helpful to sleep propped up with pillows behind your operated shoulder as well under your elbow and forearm.  This will reduce pulling on the suture lines.  When dressing, put your operative arm in the sleeve first.  When getting undressed, take your operative arm out last.  Loose fitting, button-down shirts are recommended.  In most states it is against the law to drive while your arm is in a sling. And certainly against the law to drive while taking narcotics.  You may return to work/school in the next couple of days when you feel up to it. Desk work and typing in the sling is fine.  We suggest you use the pain medication the first night prior to going to bed, in order to ease any pain when the anesthesia wears off. You should avoid taking pain medications on an empty stomach as it will make you nauseous.  You should wean off your narcotic medicines as soon as you are able.     Most patients will be off narcotics before their first postop appointment.   Do not drink alcoholic beverages or take illicit drugs when taking pain medications.  Pain medication may make you constipated.  Below are a few solutions to try in this order: Decrease the amount of pain medication if you aren't having pain. Drink lots of decaffeinated fluids. Drink prune juice and/or each dried prunes  If the first 3 don't work start with additional solutions Take Colace - an over-the-counter stool softener Take Senokot - an over-the-counter laxative Take Miralax - a stronger  over-the-counter laxative   Dental Antibiotics:  In most cases prophylactic antibiotics for Dental procdeures after total joint surgery are not necessary.  Exceptions are as follows:  1. History of prior total joint infection  2. Severely immunocompromised (Organ Transplant, cancer chemotherapy, Rheumatoid biologic meds such as Humira)  3. Poorly controlled diabetes (A1C &gt; 8.0, blood glucose over 200)  If you have one of these conditions, contact your surgeon for an antibiotic prescription, prior to your dental procedure.   For more information including helpful videos and documents visit our website:   [email or web address]

## 2023-11-13 ENCOUNTER — Ambulatory Visit (HOSPITAL_COMMUNITY): Payer: PPO

## 2023-11-13 ENCOUNTER — Encounter (HOSPITAL_COMMUNITY): Payer: Self-pay | Admitting: Orthopaedic Surgery

## 2023-11-13 ENCOUNTER — Other Ambulatory Visit: Payer: Self-pay

## 2023-11-13 ENCOUNTER — Other Ambulatory Visit (HOSPITAL_COMMUNITY): Payer: Self-pay

## 2023-11-13 ENCOUNTER — Encounter (HOSPITAL_COMMUNITY)
Admission: RE | Disposition: A | Discharge status: Home / Self Care | Payer: Self-pay | Source: Home / Self Care | Attending: Orthopaedic Surgery

## 2023-11-13 ENCOUNTER — Ambulatory Visit (HOSPITAL_COMMUNITY): Payer: PPO | Admitting: Physician Assistant

## 2023-11-13 ENCOUNTER — Observation Stay (HOSPITAL_COMMUNITY)
Admission: RE | Admit: 2023-11-13 | Discharge: 2023-11-14 | Disposition: A | Discharge status: Home / Self Care | Payer: PPO | Attending: Orthopaedic Surgery | Admitting: Orthopaedic Surgery

## 2023-11-13 ENCOUNTER — Ambulatory Visit (HOSPITAL_BASED_OUTPATIENT_CLINIC_OR_DEPARTMENT_OTHER): Payer: PPO | Admitting: Certified Registered"

## 2023-11-13 DIAGNOSIS — M19011 Primary osteoarthritis, right shoulder: Secondary | ICD-10-CM | POA: Diagnosis present

## 2023-11-13 DIAGNOSIS — Z7984 Long term (current) use of oral hypoglycemic drugs: Secondary | ICD-10-CM | POA: Insufficient documentation

## 2023-11-13 DIAGNOSIS — Z96653 Presence of artificial knee joint, bilateral: Secondary | ICD-10-CM | POA: Diagnosis not present

## 2023-11-13 DIAGNOSIS — I10 Essential (primary) hypertension: Secondary | ICD-10-CM | POA: Insufficient documentation

## 2023-11-13 DIAGNOSIS — M75101 Unspecified rotator cuff tear or rupture of right shoulder, not specified as traumatic: Secondary | ICD-10-CM | POA: Diagnosis not present

## 2023-11-13 DIAGNOSIS — Z79899 Other long term (current) drug therapy: Secondary | ICD-10-CM | POA: Diagnosis not present

## 2023-11-13 DIAGNOSIS — Z96611 Presence of right artificial shoulder joint: Principal | ICD-10-CM

## 2023-11-13 DIAGNOSIS — F419 Anxiety disorder, unspecified: Secondary | ICD-10-CM

## 2023-11-13 HISTORY — PX: REVERSE SHOULDER ARTHROPLASTY: SHX5054

## 2023-11-13 LAB — GLUCOSE, CAPILLARY
Glucose-Capillary: 108 mg/dL — ABNORMAL HIGH (ref 70–99)
Glucose-Capillary: 146 mg/dL — ABNORMAL HIGH (ref 70–99)
Glucose-Capillary: 144 mg/dL — ABNORMAL HIGH (ref 70–99)

## 2023-11-13 SURGERY — ARTHROPLASTY, SHOULDER, TOTAL, REVERSE
Anesthesia: General | Site: Shoulder | Laterality: Right

## 2023-11-13 MED ORDER — ACETAMINOPHEN 500 MG PO TABS
1000.0000 mg | ORAL_TABLET | Freq: Once | ORAL | Status: AC
  Administered 2023-11-13: 1000 mg via ORAL
  Filled 2023-11-13: qty 2

## 2023-11-13 MED ORDER — OXYCODONE HCL 5 MG/5ML PO SOLN: 5.0000 mg | Freq: Once | ORAL | Status: DC | PRN

## 2023-11-13 MED ORDER — INSULIN ASPART 100 UNIT/ML IJ SOLN
0.0000 [IU] | Freq: Three times a day (TID) | INTRAMUSCULAR | Status: DC
  Administered 2023-11-13 – 2023-11-14 (×2): 2 [IU] via SUBCUTANEOUS

## 2023-11-13 MED ORDER — SODIUM CHLORIDE 0.9 % IR SOLN
Status: DC | PRN
  Administered 2023-11-13: 1000 mL

## 2023-11-13 MED ORDER — OXYCODONE HCL 5 MG PO TABS: 5.0000 mg | ORAL_TABLET | Freq: Once | ORAL | Status: DC | PRN

## 2023-11-13 MED ORDER — CELECOXIB 200 MG PO CAPS
200.0000 mg | ORAL_CAPSULE | Freq: Two times a day (BID) | ORAL | Status: DC
  Administered 2023-11-13 – 2023-11-14 (×3): 200 mg via ORAL
  Filled 2023-11-13 (×3): qty 1

## 2023-11-13 MED ORDER — CEFAZOLIN SODIUM-DEXTROSE 2-4 GM/100ML-% IV SOLN
2.0000 g | Freq: Four times a day (QID) | INTRAVENOUS | Status: AC
  Administered 2023-11-13 – 2023-11-14 (×3): 2 g via INTRAVENOUS
  Filled 2023-11-13 (×3): qty 100

## 2023-11-13 MED ORDER — DOCUSATE SODIUM 100 MG PO CAPS
100.0000 mg | ORAL_CAPSULE | Freq: Two times a day (BID) | ORAL | Status: DC
  Administered 2023-11-13 – 2023-11-14 (×3): 100 mg via ORAL
  Filled 2023-11-13 (×3): qty 1

## 2023-11-13 MED ORDER — ONDANSETRON HCL 4 MG/2ML IJ SOLN
4.0000 mg | Freq: Once | INTRAMUSCULAR | Status: DC | PRN
Start: 2023-11-13 — End: 2023-11-13

## 2023-11-13 MED ORDER — LACTATED RINGERS IV SOLN: INTRAVENOUS | Status: DC

## 2023-11-13 MED ORDER — PHENYLEPHRINE 80 MCG/ML (10ML) SYRINGE FOR IV PUSH (FOR BLOOD PRESSURE SUPPORT)
PREFILLED_SYRINGE | INTRAVENOUS | Status: DC | PRN
  Administered 2023-11-13 (×2): 40 ug via INTRAVENOUS

## 2023-11-13 MED ORDER — HYDRALAZINE HCL 20 MG/ML IJ SOLN
INTRAMUSCULAR | Status: AC
  Filled 2023-11-13: qty 1

## 2023-11-13 MED ORDER — MIDAZOLAM HCL 2 MG/2ML IJ SOLN
INTRAMUSCULAR | Status: DC | PRN
  Administered 2023-11-13: 2 mg via INTRAVENOUS

## 2023-11-13 MED ORDER — HYDROCHLOROTHIAZIDE 25 MG PO TABS
25.0000 mg | ORAL_TABLET | Freq: Every day | ORAL | Status: DC
Start: 2023-11-14 — End: 2023-11-14
  Administered 2023-11-14: 25 mg via ORAL
  Filled 2023-11-13: qty 1

## 2023-11-13 MED ORDER — PROPOFOL 10 MG/ML IV BOLUS
INTRAVENOUS | Status: AC
  Filled 2023-11-13: qty 20

## 2023-11-13 MED ORDER — MIDAZOLAM HCL 2 MG/2ML IJ SOLN
INTRAMUSCULAR | Status: AC
  Filled 2023-11-13: qty 2

## 2023-11-13 MED ORDER — OXYCODONE HCL 5 MG PO TABS
5.0000 mg | ORAL_TABLET | ORAL | Status: DC | PRN
Start: 2023-11-13 — End: 2023-11-14

## 2023-11-13 MED ORDER — ONDANSETRON HCL 4 MG/2ML IJ SOLN: 4.0000 mg | Freq: Four times a day (QID) | INTRAMUSCULAR | Status: DC | PRN

## 2023-11-13 MED ORDER — ALBUTEROL SULFATE (2.5 MG/3ML) 0.083% IN NEBU: 3.0000 mL | INHALATION_SOLUTION | Freq: Four times a day (QID) | RESPIRATORY_TRACT | Status: DC | PRN

## 2023-11-13 MED ORDER — ASPIRIN 81 MG PO TBEC
81.0000 mg | DELAYED_RELEASE_TABLET | Freq: Two times a day (BID) | ORAL | 0 refills | Status: DC
  Filled 2023-11-13: qty 84, 42d supply, fill #0

## 2023-11-13 MED ORDER — DEXAMETHASONE SODIUM PHOSPHATE 10 MG/ML IJ SOLN
INTRAMUSCULAR | Status: AC
  Filled 2023-11-13: qty 1

## 2023-11-13 MED ORDER — METHOCARBAMOL 1000 MG/10ML IJ SOLN: 500.0000 mg | Freq: Four times a day (QID) | INTRAMUSCULAR | Status: DC | PRN

## 2023-11-13 MED ORDER — SODIUM CHLORIDE 0.9 % IV SOLN
3.0000 g | INTRAVENOUS | Status: AC
  Administered 2023-11-13: 3 g via INTRAVENOUS
  Filled 2023-11-13: qty 3

## 2023-11-13 MED ORDER — BUPIVACAINE LIPOSOME 1.3 % IJ SUSP
INTRAMUSCULAR | Status: DC | PRN
  Administered 2023-11-13: 10 mL via PERINEURAL

## 2023-11-13 MED ORDER — ROCURONIUM BROMIDE 10 MG/ML (PF) SYRINGE
PREFILLED_SYRINGE | INTRAVENOUS | Status: DC | PRN
  Administered 2023-11-13 (×2): 10 mg via INTRAVENOUS
  Administered 2023-11-13: 50 mg via INTRAVENOUS

## 2023-11-13 MED ORDER — FENTANYL CITRATE (PF) 100 MCG/2ML IJ SOLN
INTRAMUSCULAR | Status: AC
  Filled 2023-11-13: qty 2

## 2023-11-13 MED ORDER — SUGAMMADEX SODIUM 200 MG/2ML IV SOLN
INTRAVENOUS | Status: DC | PRN
  Administered 2023-11-13: 350 mg via INTRAVENOUS

## 2023-11-13 MED ORDER — INSULIN ASPART 100 UNIT/ML IJ SOLN: 0.0000 [IU] | Freq: Every day | INTRAMUSCULAR | Status: DC

## 2023-11-13 MED ORDER — ACETAMINOPHEN 500 MG PO TABS
1000.0000 mg | ORAL_TABLET | Freq: Three times a day (TID) | ORAL | 0 refills | Status: AC
Start: 2023-11-13 — End: 2023-11-28
  Filled 2023-11-13: qty 84, 14d supply, fill #0

## 2023-11-13 MED ORDER — VANCOMYCIN HCL 1000 MG IV SOLR
INTRAVENOUS | Status: AC
  Filled 2023-11-13: qty 20

## 2023-11-13 MED ORDER — CELECOXIB 100 MG PO CAPS
100.0000 mg | ORAL_CAPSULE | Freq: Two times a day (BID) | ORAL | 0 refills | Status: DC
  Filled 2023-11-13: qty 60, 30d supply, fill #0

## 2023-11-13 MED ORDER — HYDRALAZINE HCL 20 MG/ML IJ SOLN
10.0000 mg | Freq: Once | INTRAMUSCULAR | Status: AC | PRN
  Administered 2023-11-13: 10 mg via INTRAVENOUS

## 2023-11-13 MED ORDER — ONDANSETRON HCL 4 MG/2ML IJ SOLN
INTRAMUSCULAR | Status: DC | PRN
  Administered 2023-11-13: 4 mg via INTRAVENOUS

## 2023-11-13 MED ORDER — SUCCINYLCHOLINE CHLORIDE 200 MG/10ML IV SOSY
PREFILLED_SYRINGE | INTRAVENOUS | Status: AC
  Filled 2023-11-13: qty 10

## 2023-11-13 MED ORDER — DULOXETINE HCL 60 MG PO CPEP
60.0000 mg | ORAL_CAPSULE | Freq: Every morning | ORAL | Status: DC
  Administered 2023-11-14: 60 mg via ORAL
  Filled 2023-11-13: qty 1

## 2023-11-13 MED ORDER — OXYCODONE HCL 5 MG PO TABS
ORAL_TABLET | ORAL | 0 refills | Status: AC
  Filled 2023-11-13: qty 30, 4d supply, fill #0

## 2023-11-13 MED ORDER — DIPHENHYDRAMINE HCL 12.5 MG/5ML PO ELIX: 12.5000 mg | ORAL_SOLUTION | ORAL | Status: DC | PRN

## 2023-11-13 MED ORDER — INSULIN ASPART 100 UNIT/ML IJ SOLN
0.0000 [IU] | INTRAMUSCULAR | Status: DC | PRN
Start: 2023-11-13 — End: 2023-11-13

## 2023-11-13 MED ORDER — PROPOFOL 10 MG/ML IV BOLUS
INTRAVENOUS | Status: DC | PRN
  Administered 2023-11-13: 160 mg via INTRAVENOUS
  Administered 2023-11-13: 140 mg via INTRAVENOUS

## 2023-11-13 MED ORDER — IPRATROPIUM-ALBUTEROL 0.5-2.5 (3) MG/3ML IN SOLN
3.0000 mL | Freq: Once | RESPIRATORY_TRACT | Status: AC
  Administered 2023-11-13: 3 mL via RESPIRATORY_TRACT

## 2023-11-13 MED ORDER — METOCLOPRAMIDE HCL 5 MG/ML IJ SOLN: 5.0000 mg | Freq: Three times a day (TID) | INTRAMUSCULAR | Status: DC | PRN

## 2023-11-13 MED ORDER — IPRATROPIUM-ALBUTEROL 0.5-2.5 (3) MG/3ML IN SOLN
RESPIRATORY_TRACT | Status: AC
  Filled 2023-11-13: qty 3

## 2023-11-13 MED ORDER — ONDANSETRON HCL 4 MG/2ML IJ SOLN
INTRAMUSCULAR | Status: AC
Start: 2023-11-13 — End: ?
  Filled 2023-11-13: qty 2

## 2023-11-13 MED ORDER — ACETAMINOPHEN 500 MG PO TABS
1000.0000 mg | ORAL_TABLET | Freq: Four times a day (QID) | ORAL | Status: AC
  Administered 2023-11-13 – 2023-11-14 (×4): 1000 mg via ORAL
  Filled 2023-11-13 (×3): qty 2

## 2023-11-13 MED ORDER — BUPIVACAINE HCL (PF) 0.5 % IJ SOLN
INTRAMUSCULAR | Status: DC | PRN
  Administered 2023-11-13: 15 mL via PERINEURAL

## 2023-11-13 MED ORDER — OXYCODONE HCL 5 MG PO TABS: 10.0000 mg | ORAL_TABLET | ORAL | Status: DC | PRN

## 2023-11-13 MED ORDER — PANTOPRAZOLE SODIUM 40 MG PO TBEC
40.0000 mg | DELAYED_RELEASE_TABLET | Freq: Every day | ORAL | Status: DC
  Administered 2023-11-14: 40 mg via ORAL
  Filled 2023-11-13: qty 1

## 2023-11-13 MED ORDER — CHLORHEXIDINE GLUCONATE 0.12 % MT SOLN
15.0000 mL | Freq: Once | OROMUCOSAL | Status: AC
  Administered 2023-11-13: 15 mL via OROMUCOSAL

## 2023-11-13 MED ORDER — LISINOPRIL 20 MG PO TABS
40.0000 mg | ORAL_TABLET | Freq: Every day | ORAL | Status: DC
Start: 2023-11-14 — End: 2023-11-14
  Administered 2023-11-14: 40 mg via ORAL
  Filled 2023-11-13: qty 2

## 2023-11-13 MED ORDER — LIDOCAINE 2% (20 MG/ML) 5 ML SYRINGE
INTRAMUSCULAR | Status: DC | PRN
  Administered 2023-11-13: 40 mg via INTRAVENOUS

## 2023-11-13 MED ORDER — TRANEXAMIC ACID-NACL 1000-0.7 MG/100ML-% IV SOLN
1000.0000 mg | INTRAVENOUS | Status: AC
  Administered 2023-11-13: 1000 mg via INTRAVENOUS
  Filled 2023-11-13: qty 100

## 2023-11-13 MED ORDER — ONDANSETRON HCL 4 MG PO TABS
4.0000 mg | ORAL_TABLET | Freq: Three times a day (TID) | ORAL | 0 refills | Status: AC | PRN
  Filled 2023-11-13: qty 10, 4d supply, fill #0

## 2023-11-13 MED ORDER — FENTANYL CITRATE (PF) 100 MCG/2ML IJ SOLN
INTRAMUSCULAR | Status: DC | PRN
  Administered 2023-11-13: 50 ug via INTRAVENOUS

## 2023-11-13 MED ORDER — POTASSIUM CHLORIDE IN NACL 20-0.9 MEQ/L-% IV SOLN
INTRAVENOUS | Status: DC
Start: 2023-11-13 — End: 2023-11-14
  Filled 2023-11-13 (×2): qty 1000

## 2023-11-13 MED ORDER — HYDROMORPHONE HCL 1 MG/ML IJ SOLN: 0.5000 mg | INTRAMUSCULAR | Status: DC | PRN

## 2023-11-13 MED ORDER — ROCURONIUM BROMIDE 10 MG/ML (PF) SYRINGE
PREFILLED_SYRINGE | INTRAVENOUS | Status: AC
  Filled 2023-11-13: qty 10

## 2023-11-13 MED ORDER — ONDANSETRON HCL 4 MG PO TABS: 4.0000 mg | ORAL_TABLET | Freq: Four times a day (QID) | ORAL | Status: DC | PRN

## 2023-11-13 MED ORDER — DEXAMETHASONE SODIUM PHOSPHATE 10 MG/ML IJ SOLN
INTRAMUSCULAR | Status: DC | PRN
  Administered 2023-11-13: 4 mg via INTRAVENOUS

## 2023-11-13 MED ORDER — LORATADINE 10 MG PO TABS
10.0000 mg | ORAL_TABLET | Freq: Every day | ORAL | Status: DC
Start: 2023-11-14 — End: 2023-11-14
  Administered 2023-11-14: 10 mg via ORAL
  Filled 2023-11-13: qty 1

## 2023-11-13 MED ORDER — STERILE WATER FOR IRRIGATION IR SOLN
Status: DC | PRN
  Administered 2023-11-13: 1000 mL

## 2023-11-13 MED ORDER — FENTANYL CITRATE PF 50 MCG/ML IJ SOSY: 25.0000 ug | PREFILLED_SYRINGE | INTRAMUSCULAR | Status: DC | PRN

## 2023-11-13 MED ORDER — VANCOMYCIN HCL 1 G IV SOLR
INTRAVENOUS | Status: DC | PRN
  Administered 2023-11-13: 1000 mg via TOPICAL

## 2023-11-13 MED ORDER — METHOCARBAMOL 500 MG PO TABS
500.0000 mg | ORAL_TABLET | Freq: Four times a day (QID) | ORAL | Status: DC | PRN
Start: 2023-11-13 — End: 2023-11-14

## 2023-11-13 MED ORDER — METOCLOPRAMIDE HCL 5 MG PO TABS
5.0000 mg | ORAL_TABLET | Freq: Three times a day (TID) | ORAL | Status: DC | PRN
Start: 2023-11-13 — End: 2023-11-14

## 2023-11-13 MED ORDER — 0.9 % SODIUM CHLORIDE (POUR BTL) OPTIME
TOPICAL | Status: DC | PRN
  Administered 2023-11-13: 1000 mL

## 2023-11-13 MED ORDER — PHENYLEPHRINE 80 MCG/ML (10ML) SYRINGE FOR IV PUSH (FOR BLOOD PRESSURE SUPPORT)
PREFILLED_SYRINGE | INTRAVENOUS | Status: AC
  Filled 2023-11-13: qty 10

## 2023-11-13 MED ORDER — LISINOPRIL-HYDROCHLOROTHIAZIDE 20-12.5 MG PO TABS: 2.0000 | ORAL_TABLET | Freq: Every morning | ORAL | Status: DC

## 2023-11-13 MED ORDER — ORAL CARE MOUTH RINSE: 15.0000 mL | Freq: Once | OROMUCOSAL | Status: AC

## 2023-11-13 SURGICAL SUPPLY — 58 items
AUG BASEPLATE 15DEG 25 WEDGE (Joint) ×1 IMPLANT
AUGMENT BASEPLATE 15DEG 25 WDG (Joint) IMPLANT
BAG COUNTER SPONGE SURGICOUNT (BAG) ×1 IMPLANT
BIT DRILL 3.2 PERIPHERAL SCREW (BIT) IMPLANT
BLADE SAW SGTL 73X25 THK (BLADE) ×1 IMPLANT
CHLORAPREP W/TINT 26 (MISCELLANEOUS) ×2 IMPLANT
CLSR STERI-STRIP ANTIMIC 1/2X4 (GAUZE/BANDAGES/DRESSINGS) ×1 IMPLANT
COOLER ICEMAN CLASSIC (MISCELLANEOUS) IMPLANT
COVER BACK TABLE 60X90IN (DRAPES) ×1 IMPLANT
COVER SURGICAL LIGHT HANDLE (MISCELLANEOUS) ×1 IMPLANT
CUP HUM SYS INSERT SZ 1/2 36 (Joint) IMPLANT
DRAPE C-ARM 42X120 X-RAY (DRAPES) IMPLANT
DRAPE INCISE IOBAN 66X45 STRL (DRAPES) ×1 IMPLANT
DRAPE POUCH INSTRU U-SHP 10X18 (DRAPES) ×1 IMPLANT
DRAPE SHEET LG 3/4 BI-LAMINATE (DRAPES) ×2 IMPLANT
DRAPE SURG ORHT 6 SPLT 77X108 (DRAPES) ×2 IMPLANT
DRSG AQUACEL AG ADV 3.5X 6 (GAUZE/BANDAGES/DRESSINGS) ×1 IMPLANT
ELECT BLADE TIP CTD 4 INCH (ELECTRODE) ×1 IMPLANT
ELECT PENCIL ROCKER SW 15FT (MISCELLANEOUS) IMPLANT
ELECT REM PT RETURN 15FT ADLT (MISCELLANEOUS) ×1 IMPLANT
FACESHIELD WRAPAROUND (MASK) ×3
FACESHIELD WRAPAROUND OR TEAM (MASK) ×3 IMPLANT
GLENOSPHERE REV SHOULDER 36 (Joint) IMPLANT
GLOVE BIO SURGEON STRL SZ 6.5 (GLOVE) ×2 IMPLANT
GLOVE BIOGEL PI IND STRL 6.5 (GLOVE) ×1 IMPLANT
GLOVE BIOGEL PI IND STRL 8 (GLOVE) ×1 IMPLANT
GLOVE ECLIPSE 8.0 STRL XLNG CF (GLOVE) ×2 IMPLANT
GOWN STRL REUS W/ TWL LRG LVL3 (GOWN DISPOSABLE) ×2 IMPLANT
GUIDE PIN 3X75 SHOULDER (PIN) ×1
GUIDEWIRE GLENOID 2.5X220 (WIRE) IMPLANT
KIT BASIN OR (CUSTOM PROCEDURE TRAY) ×1 IMPLANT
KIT STABILIZATION SHOULDER (MISCELLANEOUS) ×1 IMPLANT
KIT TURNOVER KIT A (KITS) IMPLANT
MANIFOLD NEPTUNE II (INSTRUMENTS) ×1 IMPLANT
NS IRRIG 1000ML POUR BTL (IV SOLUTION) ×1 IMPLANT
PACK SHOULDER (CUSTOM PROCEDURE TRAY) ×1 IMPLANT
PAD COLD SHLDR WRAP-ON (PAD) IMPLANT
PAD ORTHO SHOULDER 7X19 LRG (SOFTGOODS) IMPLANT
PIN GUIDE 3X75 SHOULDER (PIN) IMPLANT
RESTRAINT HEAD UNIVERSAL NS (MISCELLANEOUS) ×1 IMPLANT
SCREW 5.5X26 (Screw) IMPLANT
SCREW BONE 6.5X40 SM (Screw) IMPLANT
SCREW PERIPHERAL 30 (Screw) IMPLANT
SET HNDPC FAN SPRY TIP SCT (DISPOSABLE) ×1 IMPLANT
SLING ARM IMMOBILIZER XL (CAST SUPPLIES) IMPLANT
SPONGE T-LAP 4X18 ~~LOC~~+RFID (SPONGE) ×1 IMPLANT
STEM HUMERAL STD SHORT SIZE 2 (Orthopedic Implant) IMPLANT
SUCTION TUBE FRAZIER 12FR DISP (SUCTIONS) IMPLANT
SUT ETHIBOND 2 V 37 (SUTURE) ×1 IMPLANT
SUT ETHIBOND NAB CT1 #1 30IN (SUTURE) ×1 IMPLANT
SUT FIBERWIRE #5 38 CONV NDL (SUTURE) ×2
SUT MNCRL AB 4-0 PS2 18 (SUTURE) ×1 IMPLANT
SUT VIC AB 0 CT1 36 (SUTURE) IMPLANT
SUT VIC AB 3-0 SH 27X BRD (SUTURE) ×1 IMPLANT
SUTURE FIBERWR #5 38 CONV NDL (SUTURE) ×2 IMPLANT
TOWEL OR 17X26 10 PK STRL BLUE (TOWEL DISPOSABLE) ×1 IMPLANT
TUBE SUCTION HIGH CAP CLEAR NV (SUCTIONS) ×1 IMPLANT
WATER STERILE IRR 1000ML POUR (IV SOLUTION) ×2 IMPLANT

## 2023-11-13 NOTE — Transfer of Care (Signed)
Immediate Anesthesia Transfer of Care Note  Patient: Crystal Potter  Procedure(s) Performed: REVERSE SHOULDER ARTHROPLASTY (Right: Shoulder)  Patient Location: PACU  Anesthesia Type:General  Level of Consciousness: awake, alert , and patient cooperative  Airway & Oxygen Therapy: Patient Spontanous Breathing and Patient connected to face mask oxygen  Post-op Assessment: Report given to RN and Post -op Vital signs reviewed and stable  Post vital signs: Reviewed and stable  Last Vitals:  Vitals Value Taken Time  BP 190/104 11/13/23 0945  Temp    Pulse 92 11/13/23 0946  Resp 21 11/13/23 0946  SpO2 96 % 11/13/23 0946  Vitals shown include unfiled device data.  Last Pain:  Vitals:   11/13/23 0636  TempSrc: Oral  PainSc:          Complications: No notable events documented.

## 2023-11-13 NOTE — Interval H&P Note (Signed)
All questions answered

## 2023-11-13 NOTE — Plan of Care (Signed)
Problem: Education: Goal: Knowledge of General Education information will improve Description: Including pain rating scale, medication(s)/side effects and non-pharmacologic comfort measures Outcome: Progressing   Problem: Health Behavior/Discharge Planning: Goal: Ability to manage health-related needs will improve Outcome: Progressing   Problem: Clinical Measurements: Goal: Ability to maintain clinical measurements within normal limits will improve Outcome: Progressing   Problem: Activity: Goal: Risk for activity intolerance will decrease Outcome: Progressing   Problem: Coping: Goal: Level of anxiety will decrease Outcome: Progressing   Haydee Salter, RN 11/13/23 7:43 PM

## 2023-11-13 NOTE — Anesthesia Procedure Notes (Signed)
Anesthesia Regional Block: Interscalene brachial plexus block   Pre-Anesthetic Checklist: , timeout performed,  Correct Patient, Correct Site, Correct Laterality,  Correct Procedure, Correct Position, site marked,  Risks and benefits discussed,  Surgical consent,  Pre-op evaluation,  At surgeon's request and post-op pain management  Laterality: Right  Prep: chloraprep       Needles:  Injection technique: Single-shot  Needle Type: Echogenic Stimulator Needle     Needle Length: 10cm  Needle Gauge: 21   Needle insertion depth: 8 cm   Additional Needles:   Procedures:,,,, ultrasound used (permanent image in chart),,    Narrative:  Start time: 11/13/2023 7:49 AM End time: 11/13/2023 7:54 AM Injection made incrementally with aspirations every 5 mL.  Performed by: Personally  Anesthesiologist: Mal Amabile, MD  Additional Notes: Timeout performed. Patient sedated. Relevant anatomy ID'd using [place]. Incremental 2-49ml injection of LA with frequent aspiration. Patient tolerated procedure well.

## 2023-11-13 NOTE — Op Note (Signed)
Orthopaedic Surgery Operative Note (CSN: [id number])  Crystal Potter  12/13/1950 Date of Surgery: 11/13/2023   Diagnoses:  Right shoulder primary glenohumeral arthritis with cuff tear  Procedure: Right reverse augmented total Shoulder Arthroplasty   Operative Finding Successful completion of planned procedure.  Patient's body habitus made her surgery quite difficult with a BMI of 55.  It was difficult to adductor her arm across her body in order to implant her stem.  Additionally we had to use an external cut guide.  That said we had reasonable fixation and good stability at the end of the case.  She is at higher than normal risk of instability secondary to the weight of her arm.  Anesthesia said that surgery went well however they were worried that she may have trouble with oxygen saturation secondary to her body habitus and reserves and may end up needing to be held overnight for observation though we are planning for day of surgery discharge as her request was.  Post-operative plan: The patient will be NWB in sling.  The patient will be will be discharged from PACU if continues to be stable as was plan prior to surgery.  DVT prophylaxis Aspirin 81 mg twice daily for 6 weeks.  Pain control with PRN pain medication preferring oral medicines.  Follow up plan will be scheduled in approximately 7 days for incision check and XR.  Physical therapy to start after week.  Implants: Tornier perform humeral size 2 stem, 0 retentive polyethylene, 36 standard glenosphere with a 25 full wedge baseplate with a 40 center screw and 4 peripheral screws  Post-Op Diagnosis: Same Surgeons:Primary: Bjorn Pippin, MD Assistants:Caroline McBane, PA-C Location: [facility] ROOM 06 Anesthesia: General with Exparel Interscalene Antibiotics: Ancef 3g preop, Vancomycin 1000mg  locally Tourniquet time: None Estimated Blood Loss: 100 Complications: None Specimens: None Implants: Implant Name Type Inv. Item Serial No.  Manufacturer Lot No. LRB No. Used Action  AUG BASEPLATE 15DEG 25 WEDGE - [id number] Joint AUG BASEPLATE 15DEG 25 WEDGE [id number] [employer]  Right 1 Implanted  SCREW BONE 6.5X40 SM - [id number] Screw SCREW BONE 6.5X40 SM  [employer]  Right 1 Implanted  SCREW 5.5X26 - [id number] Screw SCREW 5.5X26  [employer]  Right 3 Implanted  SCREW PERIPHERAL 30 - [id number] Screw SCREW PERIPHERAL 30  [employer]  Right 1 Implanted  GLENOSPHERE REV SHOULDER 36 - [id number] Joint GLENOSPHERE REV SHOULDER 36 [id number] [employer]  Right 1 Implanted  STEM HUMERAL STD SHORT SIZE 2 - [id number] Orthopedic Implant STEM HUMERAL STD SHORT SIZE 2 [id number] [employer]  Right 1 Implanted  CUP HUM SYS INSERT SZ 1/2 36 - [id number] Joint CUP HUM SYS INSERT SZ 1/2 36 [id number] [employer]  Right 1 Implanted    Indications for Surgery:   Crystal Potter is a 73 y.o. female with end-stage arthritis with cuff tear.  Benefits and risks of operative and nonoperative management were discussed prior to surgery with patient/guardian(s) and informed consent form was completed.  Infection and need for further surgery were discussed as was prosthetic stability and cuff issues.  We additionally specifically discussed risks of axillary nerve injury, infection, periprosthetic fracture, continued pain and longevity of implants prior to beginning procedure.      Procedure:   The patient was identified in the preoperative holding area where the surgical site was marked. Block placed by anesthesia with exparel.  The patient was taken to the OR where a procedural timeout was called and the above noted anesthesia was  induced.  The patient was positioned beachchair on allen table with spider arm positioner.  Preoperative antibiotics were dosed.  The patient's right shoulder was prepped and draped in the usual sterile fashion.  A second preoperative timeout was called.       Standard deltopectoral approach was performed with a #10 blade.  We dissected down to the subcutaneous tissues and the cephalic vein was taken laterally with the deltoid. Clavipectoral fascia was incised in line with the incision. Deep retractors were placed. The long of the biceps tendon was identified and there was significant tenosynovitis present.  Tenodesis was performed to the pectoralis tendon with #2 Ethibond. The remaining biceps was followed up into the rotator interval where it was released.   The subscapularis was taken down in a full thickness layer with capsule along the humeral neck extending inferiorly around the humeral head. We continued releasing the capsule directly off of the osteophytes inferiorly all the way around the corner. This allowed [place] to dislocate the humeral head.   The humeral head had evidence of severe osteoarthritic wear with full-thickness cartilage loss and exposed subchondral bone. There was significant flattening of the humeral head.   The rotator cuff was carefully examined and noted to be irreperably torn.  The decision was confirmed that a reverse total shoulder was indicated for this patient.  There were osteophytes along the inferior humeral neck. The osteophytes were removed with an osteotome and a rongeur.  Osteophytes were removed with a rongeur and an osteotome and the anatomic neck was well visualized.     A humeral cutting guide was used extra medullary with a pin to help control version. The version was set at 20 of retroversion. Humeral osteotomy was performed with an oscillating saw. The head fragment was passed off the back table.  A cut protector plate was placed.  The subscapularis was again identified and immediately we took care to palpate the axillary nerve anteriorly and verify its position with gentle palpation as well as the tug test.  We then released the SGHL with bovie cautery prior to placing a curved mayo at the junction of the anterior glenoid well above the axillary nerve and bluntly dissecting the  subscapularis from the capsule.  We then carefully protected the axillary nerve as we gently released the inferior capsule to fully mobilize the subscapularis.  An anterior deltoid retractor was then placed as well as a small Hohmann retractor superiorly.   The glenoid was inspected and had evidence of severe osteoarthritic wear with full-thickness cartilage loss and exposed subchondral bone.    The remaining labrum was removed circumferentially taking great care not to disrupt the posterior capsule.   At this point we felt based on blueprint templating that a full wedge augment was necessary.  We began by using a full wedge guide to place our center pin as was templated.  We had good position of this pin and we proceeded with our starter center drill.  This allowed for [place] to use the 15 degree full wedge reamer obtaining circumferential witness marks and good bone preparation for ingrowth.  At this point we proceeded with our center drill and had an intact vault.  We then drilled our center screw to a length of 40 mm.    We selected a 6.5 mm x 40 mm screw and the full wedge baseplate which was placed in the same orientation as our reaming.  We double checked that we had good apposition of the base plate  to bone and then proceeded to place 3 locking screws and one nonlocking screw as is typical.   Next a 36 mm glenosphere was selected and impacted onto the baseplate. The center screw was tightened.  We turned attention back to the humeral side. The cut protector was removed.  We used the perform humeral sizing block to select the appropriate size which for this patient was a 2.  We then placed our center pin and reamed over it concentrically obtaining appropriate inset.  We then used our lateralizing chisel to prepare the lateral aspect of the humerus.  At that point we selected the appropriate implant trialing a 2.  Using this trial implant we trialed multiple polyethylene sizes settling on a 0 which  provided good stability and range of motion without excess soft tissue tension. The offset was dialed in to match the normal anatomy. The shoulder was trialed.  There was good ROM in all planes and the shoulder was stable with no inferior translation.  The real humeral implants were opened after again confirming sizes.  The trial was removed. #5 Fiberwire x4 sutures passed through the humeral neck for subscap repair. The humeral component was press-fit obtaining a secure fit. The joint was reduced and thoroughly irrigated with pulsatile lavage. Subscap was repaired back with #5 Fiberwire sutures through bone tunnels. Hemostasis was obtained. The deltopectoral interval was reapproximated with #1 Ethibond. The subcutaneous tissues were closed with 2-0 Vicryl and the skin was closed with running monocryl.    The wounds were cleaned and dried and an Aquacel dressing was placed. The drapes taken down. The arm was placed into sling with abduction pillow. Patient was awakened, extubated, and transferred to the recovery room in stable condition. There were no intraoperative complications. The sponge, needle, and attention counts were  correct at the end of the case.     Alfonse Alpers, PA-C, present and scrubbed throughout the case, critical for completion in a timely fashion, and for retraction, instrumentation, closure.

## 2023-11-13 NOTE — Anesthesia Postprocedure Evaluation (Signed)
Anesthesia Post Note  Patient: Crystal Potter  Procedure(s) Performed: REVERSE SHOULDER ARTHROPLASTY (Right: Shoulder)     Patient location during evaluation: PACU Anesthesia Type: General Level of consciousness: awake and alert and oriented Pain management: pain level controlled Vital Signs Assessment: post-procedure vital signs reviewed and stable Respiratory status: spontaneous breathing, nonlabored ventilation, respiratory function stable and patient connected to nasal cannula oxygen Cardiovascular status: blood pressure returned to baseline and stable Postop Assessment: no apparent nausea or vomiting Anesthetic complications: no   No notable events documented.  Last Vitals:  Vitals:   11/13/23 1100 11/13/23 1115  BP: (!) 175/92 (!) 165/77  Pulse: 89 80  Resp: 17 17  Temp:    SpO2: 92% 93%    Last Pain:  Vitals:   11/13/23 1110  TempSrc:   PainSc: 0-No pain                 Adylynn Hertenstein A.

## 2023-11-13 NOTE — Anesthesia Procedure Notes (Signed)
Procedure Name: Intubation Date/Time: 11/13/2023 8:10 AM  Performed by: Sindy Guadeloupe, CRNAPre-anesthesia Checklist: Patient identified, Emergency Drugs available, Suction available, Patient being monitored and Timeout performed Patient Re-evaluated:Patient Re-evaluated prior to induction Oxygen Delivery Method: Circle system utilized Preoxygenation: Pre-oxygenation with 100% oxygen Induction Type: IV induction and Rapid sequence Laryngoscope Size: Glidescope and 3 Grade View: Grade I Tube type: Oral Tube size: 7.0 mm Number of attempts: 1 Airway Equipment and Method: Stylet Placement Confirmation: ETT inserted through vocal cords under direct vision, positive ETCO2 and breath sounds checked- equal and bilateral Secured at: 21 cm Tube secured with: Tape Dental Injury: Teeth and Oropharynx as per pre-operative assessment

## 2023-11-14 ENCOUNTER — Other Ambulatory Visit (HOSPITAL_COMMUNITY): Payer: Self-pay

## 2023-11-14 ENCOUNTER — Encounter (HOSPITAL_COMMUNITY): Payer: Self-pay | Admitting: Orthopaedic Surgery

## 2023-11-14 DIAGNOSIS — M19011 Primary osteoarthritis, right shoulder: Secondary | ICD-10-CM | POA: Diagnosis not present

## 2023-11-14 LAB — GLUCOSE, CAPILLARY
Glucose-Capillary: 118 mg/dL — ABNORMAL HIGH (ref 70–99)
Glucose-Capillary: 136 mg/dL — ABNORMAL HIGH (ref 70–99)

## 2023-11-14 MED ORDER — RIVAROXABAN 10 MG PO TABS
10.0000 mg | ORAL_TABLET | Freq: Every day | ORAL | 0 refills | Status: AC
Start: 2023-11-14 — End: 2023-12-14
  Filled 2023-11-14: qty 30, 30d supply, fill #0

## 2023-11-14 MED ORDER — METHOCARBAMOL 500 MG PO TABS
500.0000 mg | ORAL_TABLET | Freq: Three times a day (TID) | ORAL | 0 refills | Status: DC | PRN
Start: 2023-11-14 — End: 2024-03-17
  Filled 2023-11-14: qty 30, 10d supply, fill #0

## 2023-11-14 NOTE — Progress Notes (Signed)
   ORTHOPAEDIC PROGRESS NOTE  s/p Procedure(s): REVERSE SHOULDER ARTHROPLASTY  SUBJECTIVE: Reports moderate pain about operative site. Daughter at bedside. She is on nasal cannula on 4L. She does use a CPAP at night. Does not use oxygen at home.  OBJECTIVE: PE: General: resting in hospital bed. NAD Cardiac: regular rate Pulmonary: on nasal cannula - 4L RUE: Dressing CDI and sling well fitting.  Axillary nerve sensation/motor altered in setting of block and unable to be fully tested.  Distal motor and sensory altered in setting of block. Warm well perfused hand.    Vitals:   11/14/23 0156 11/14/23 0614  BP: (!) 143/78 (!) 162/79  Pulse: 82 85  Resp: 18 16  Temp:  98.3 F (36.8 C)  SpO2: 96% 98%   Stable post op images.   ASSESSMENT: Crystal Potter is a 73 y.o. female POD#1  PLAN: Weightbearing: NWB RUE Insicional and dressing care: Reinforce dressings as needed Orthopedic device(s):  Sling Showering: post-op day #2 VTE prophylaxis: Xarelto Pain control: PRN pain medications, minimize narcotics as able Follow - up plan: 1 week in office Dispo: TBD. Is currently on nasal cannula. We need to wean her off oxygen prior to discharge. Once she is off nasal cannula and normal O2 sats on room air, patient to discharge home.   Contact information:  Weekdays 8-5 Dr. Ramond Marrow, Alfonse Alpers PA-C, After hours and holidays please check [email or web address] for group call information for [facility]   Alfonse Alpers, PA-C 11/14/23

## 2023-11-14 NOTE — Plan of Care (Signed)
  Problem: Clinical Measurements: Goal: Ability to maintain clinical measurements within normal limits will improve Outcome: Progressing   Problem: Education: Goal: Knowledge of the prescribed therapeutic regimen will improve Outcome: Progressing   Problem: Pain Management: Goal: Pain level will decrease with appropriate interventions Outcome: Progressing

## 2023-11-14 NOTE — Care Management Obs Status (Signed)
MEDICARE OBSERVATION STATUS NOTIFICATION   Patient Details  Name: Crystal Potter MRN: [id number] Date of Birth: Jun 09, 1951   Medicare Observation Status Notification Given:       Howell Rucks, RN 11/14/2023, 9:27 AM

## 2023-11-14 NOTE — Plan of Care (Signed)
Problem: Education: Goal: Knowledge of General Education information will improve Description: Including pain rating scale, medication(s)/side effects and non-pharmacologic comfort measures 11/14/2023 0733 by Haydee Salter, RN Outcome: Progressing   Problem: Clinical Measurements: Goal: Ability to maintain clinical measurements within normal limits will improve 11/14/2023 0733 by Haydee Salter, RN Outcome: Progressing  Problem: Activity: Goal: Risk for activity intolerance will decrease 11/14/2023 0733 by Haydee Salter, RN Outcome: Progressing   Haydee Salter, RN 11/14/23 7:34 AM

## 2023-11-14 NOTE — Discharge Summary (Signed)
Patient ID: CARTIER MENZA MRN: [id number] DOB/AGE: 06-14-1951 72 y.o.  Admit date: 11/13/2023 Discharge date: 11/14/2023  Admission Diagnoses:Right shoulder primary glenohumeral arthritis with cuff tear   Discharge Diagnoses:  Principal Problem:   S/P reverse total shoulder arthroplasty, right Active Problems:   Status post total shoulder arthroplasty, right   Past Medical History:  Diagnosis Date   Anxiety    h/o panic attack- prior surgery for achilles    Arthritis    knees, back, hand -R    Bell's palsy 10/23/2003   DI (detrusor instability)    urgency- fr time to time    Fibroid    GERD (gastroesophageal reflux disease)    H/O hiatal hernia    History of kidney stones    Hypertension    followed by PCP, Dr. Wynelle Link   Neuromuscular disorder [facility])    Bells Palsy - R side of face - 2005   Pre-diabetes    Sleep apnea    CPAP     Procedures Performed: Right reverse augmented total Shoulder Arthroplasty   Discharged Condition: stable  Hospital Course: Patient brought in as an outpatient for surgery.  She tolerated procedure well.  She was kept for monitoring overnight to monitor her oxygen saturation. She was found to be stable for DC home the morning after surgery.  Patient was instructed on specific activity restrictions and all questions were answered.  Consults: None  Significant Diagnostic Studies: No additional pertinent studies  Treatments: Surgery  Discharge Exam: General: resting in hospital bed. NAD Cardiac: regular rate Pulmonary: on nasal cannula - 4L RUE: Dressing CDI and sling well fitting.  Axillary nerve sensation/motor altered in setting of block and unable to be fully tested.  Distal motor and sensory altered in setting of block. Warm well perfused hand.     Disposition: Discharge disposition: 01-Home or Self Care       Discharge Instructions     Call MD for:  redness, tenderness, or signs of infection (pain, swelling, redness, odor or  green/yellow discharge around incision site)   Complete by: As directed    Call MD for:  redness, tenderness, or signs of infection (pain, swelling, redness, odor or green/yellow discharge around incision site)   Complete by: As directed    Call MD for:  severe uncontrolled pain   Complete by: As directed    Call MD for:  severe uncontrolled pain   Complete by: As directed    Call MD for:  temperature >100.4   Complete by: As directed    Call MD for:  temperature >100.4   Complete by: As directed    Diet - low sodium heart healthy   Complete by: As directed    Diet - low sodium heart healthy   Complete by: As directed       Allergies as of 11/14/2023   No Known Allergies      Medication List     STOP taking these medications    aspirin EC 81 MG tablet       TAKE these medications    Acetaminophen Extra Strength 500 MG Tabs Take 2 tablets (1,000 mg total) by mouth every 8 (eight) hours for 14 days.   albuterol 108 (90 Base) MCG/ACT inhaler Commonly known as: VENTOLIN HFA Inhale 1-2 puffs into the lungs every 6 (six) hours as needed for wheezing or shortness of breath.   CALCIUM + D PO Take 2 tablets by mouth in the morning.   docusate sodium  100 MG capsule Commonly known as: COLACE Take 200 mg by mouth in the morning.   doxycycline 100 MG capsule Commonly known as: VIBRAMYCIN Take 100 mg by mouth 2 (two) times daily.   DULoxetine 60 MG capsule Commonly known as: CYMBALTA Take 1 capsule (60 mg total) by mouth every morning.   esomeprazole 20 MG capsule Commonly known as: NEXIUM Take 20 mg by mouth daily before breakfast.   FISH OIL PO Take 1,200 mg by mouth in the morning.   lisinopril-hydrochlorothiazide 20-12.5 MG tablet Commonly known as: ZESTORETIC Take 2 tablets by mouth in the morning.   loratadine 10 MG tablet Commonly known as: CLARITIN Take 10 mg by mouth in the morning.   metFORMIN 500 MG tablet Commonly known as: GLUCOPHAGE Take 1,000  mg by mouth every evening.   methocarbamol 500 MG tablet Commonly known as: ROBAXIN Take 1 tablet (500 mg total) by mouth every 8 (eight) hours as needed for muscle spasms.   ondansetron 4 MG tablet Commonly known as: Zofran Take 1 tablet (4 mg total) by mouth every 8 (eight) hours as needed for up to 7 days for nausea or vomiting.   oxyCODONE 5 MG immediate release tablet Commonly known as: Oxy IR/ROXICODONE Take 1-2 tablets by mouth every 6 hours as needed for severe pain.  Do not exceed 6 tablets per day.   vitamin C 1000 MG tablet Take 1,000 mg by mouth in the morning.   vitamin E 180 MG (400 UNITS) capsule Take 400 Units by mouth in the morning.   Xarelto 10 MG Tabs tablet Generic drug: rivaroxaban Take 1 tablet (10 mg total) by mouth daily. For DVT prophylaxis after surgery        Follow-up Information     Bjorn Pippin, MD. Go on 11/21/2023.   Specialty: Orthopedic Surgery Why: your appointment is scheduled for 4:30 Contact information: [address] [place] [address] [phone]         [facility] Follow up.   Why: your outpatient physical therapy is scheduled for 9:30. Please arrive at 9:15 to complete your paperwork Contact information: [facility] [address] [place] [address] [phone]                 Alfonse Alpers, PA-C 11/14/23

## 2023-11-14 NOTE — TOC Transition Note (Signed)
Transition of Care [facility]) - Discharge Note   Patient Details  Name: Crystal Potter MRN: [id number] Date of Birth: 1951-06-06  Transition of Care [facility]) CM/SW Contact:  Howell Rucks, RN Phone Number: 11/14/2023, 9:33 AM   Clinical Narrative:   Met with pt at bedside to review discharge therapy and DME follow up, pt confirmed OPPT at [facility], has all home DME. No TOC needs.     Final next level of care: OP Rehab Barriers to Discharge: No Barriers Identified   Patient Goals and CMS Choice Patient states their goals for this hospitalization and ongoing recovery are:: return home          Discharge Placement                       Discharge Plan and Services Additional resources added to the After Visit Summary for                                       Social Drivers of Health (SDOH) Interventions SDOH Screenings   Food Insecurity: No Food Insecurity (11/13/2023)  Housing: Low Risk  (11/13/2023)  Transportation Needs: No Transportation Needs (11/13/2023)  Utilities: Not At Risk (11/13/2023)  Social Connections: Moderately Integrated (11/13/2023)  Tobacco Use: Low Risk  (11/13/2023)     Readmission Risk Interventions     No data to display

## 2023-11-14 NOTE — Progress Notes (Signed)
TOC discharge meds in a secure bag delivered to pt in room

## 2023-11-14 NOTE — Progress Notes (Signed)
SATURATION QUALIFICATIONS: (This note is used to comply with regulatory documentation for home oxygen)  Patient Saturations on Room Air at Rest = 94%  Patient Saturations on Room Air while Ambulating = 89% (toward end of walk in hallway)  Patient saturations improve with coughing, deep breathing, and incentive spirometer use. Patient motivated to perform these activities.  Haydee Salter, RN 11/14/23 10:36 AM

## 2023-12-19 DIAGNOSIS — M19011 Primary osteoarthritis, right shoulder: Secondary | ICD-10-CM | POA: Diagnosis not present

## 2023-12-19 DIAGNOSIS — M6281 Muscle weakness (generalized): Secondary | ICD-10-CM | POA: Diagnosis not present

## 2023-12-19 DIAGNOSIS — M25611 Stiffness of right shoulder, not elsewhere classified: Secondary | ICD-10-CM | POA: Diagnosis not present

## 2023-12-23 DIAGNOSIS — M19011 Primary osteoarthritis, right shoulder: Secondary | ICD-10-CM | POA: Diagnosis not present

## 2023-12-23 DIAGNOSIS — M25611 Stiffness of right shoulder, not elsewhere classified: Secondary | ICD-10-CM | POA: Diagnosis not present

## 2023-12-23 DIAGNOSIS — M6281 Muscle weakness (generalized): Secondary | ICD-10-CM | POA: Diagnosis not present

## 2023-12-26 DIAGNOSIS — M25611 Stiffness of right shoulder, not elsewhere classified: Secondary | ICD-10-CM | POA: Diagnosis not present

## 2023-12-26 DIAGNOSIS — M19011 Primary osteoarthritis, right shoulder: Secondary | ICD-10-CM | POA: Diagnosis not present

## 2023-12-26 DIAGNOSIS — M6281 Muscle weakness (generalized): Secondary | ICD-10-CM | POA: Diagnosis not present

## 2024-01-06 DIAGNOSIS — M25611 Stiffness of right shoulder, not elsewhere classified: Secondary | ICD-10-CM | POA: Diagnosis not present

## 2024-01-06 DIAGNOSIS — M19011 Primary osteoarthritis, right shoulder: Secondary | ICD-10-CM | POA: Diagnosis not present

## 2024-01-06 DIAGNOSIS — M6281 Muscle weakness (generalized): Secondary | ICD-10-CM | POA: Diagnosis not present

## 2024-01-13 DIAGNOSIS — M19011 Primary osteoarthritis, right shoulder: Secondary | ICD-10-CM | POA: Diagnosis not present

## 2024-01-13 DIAGNOSIS — M6281 Muscle weakness (generalized): Secondary | ICD-10-CM | POA: Diagnosis not present

## 2024-01-13 DIAGNOSIS — M25611 Stiffness of right shoulder, not elsewhere classified: Secondary | ICD-10-CM | POA: Diagnosis not present

## 2024-01-16 DIAGNOSIS — H43393 Other vitreous opacities, bilateral: Secondary | ICD-10-CM | POA: Diagnosis not present

## 2024-01-20 DIAGNOSIS — Z1231 Encounter for screening mammogram for malignant neoplasm of breast: Secondary | ICD-10-CM | POA: Diagnosis not present

## 2024-01-20 DIAGNOSIS — M6281 Muscle weakness (generalized): Secondary | ICD-10-CM | POA: Diagnosis not present

## 2024-01-20 DIAGNOSIS — M25611 Stiffness of right shoulder, not elsewhere classified: Secondary | ICD-10-CM | POA: Diagnosis not present

## 2024-01-20 DIAGNOSIS — M19011 Primary osteoarthritis, right shoulder: Secondary | ICD-10-CM | POA: Diagnosis not present

## 2024-01-23 DIAGNOSIS — Z85828 Personal history of other malignant neoplasm of skin: Secondary | ICD-10-CM | POA: Diagnosis not present

## 2024-01-23 DIAGNOSIS — L570 Actinic keratosis: Secondary | ICD-10-CM | POA: Diagnosis not present

## 2024-01-23 DIAGNOSIS — L812 Freckles: Secondary | ICD-10-CM | POA: Diagnosis not present

## 2024-01-23 DIAGNOSIS — L820 Inflamed seborrheic keratosis: Secondary | ICD-10-CM | POA: Diagnosis not present

## 2024-01-23 DIAGNOSIS — G4733 Obstructive sleep apnea (adult) (pediatric): Secondary | ICD-10-CM | POA: Diagnosis not present

## 2024-01-23 DIAGNOSIS — L821 Other seborrheic keratosis: Secondary | ICD-10-CM | POA: Diagnosis not present

## 2024-01-27 DIAGNOSIS — M19011 Primary osteoarthritis, right shoulder: Secondary | ICD-10-CM | POA: Diagnosis not present

## 2024-01-27 DIAGNOSIS — M6281 Muscle weakness (generalized): Secondary | ICD-10-CM | POA: Diagnosis not present

## 2024-01-27 DIAGNOSIS — M25611 Stiffness of right shoulder, not elsewhere classified: Secondary | ICD-10-CM | POA: Diagnosis not present

## 2024-02-03 DIAGNOSIS — M25611 Stiffness of right shoulder, not elsewhere classified: Secondary | ICD-10-CM | POA: Diagnosis not present

## 2024-02-03 DIAGNOSIS — M19011 Primary osteoarthritis, right shoulder: Secondary | ICD-10-CM | POA: Diagnosis not present

## 2024-02-03 DIAGNOSIS — M6281 Muscle weakness (generalized): Secondary | ICD-10-CM | POA: Diagnosis not present

## 2024-02-04 DIAGNOSIS — G4733 Obstructive sleep apnea (adult) (pediatric): Secondary | ICD-10-CM | POA: Diagnosis not present

## 2024-02-14 DIAGNOSIS — M19011 Primary osteoarthritis, right shoulder: Secondary | ICD-10-CM | POA: Diagnosis not present

## 2024-02-17 DIAGNOSIS — M19011 Primary osteoarthritis, right shoulder: Secondary | ICD-10-CM | POA: Diagnosis not present

## 2024-02-17 DIAGNOSIS — M6281 Muscle weakness (generalized): Secondary | ICD-10-CM | POA: Diagnosis not present

## 2024-02-17 DIAGNOSIS — M25611 Stiffness of right shoulder, not elsewhere classified: Secondary | ICD-10-CM | POA: Diagnosis not present

## 2024-02-24 DIAGNOSIS — M19011 Primary osteoarthritis, right shoulder: Secondary | ICD-10-CM | POA: Diagnosis not present

## 2024-02-24 DIAGNOSIS — M6281 Muscle weakness (generalized): Secondary | ICD-10-CM | POA: Diagnosis not present

## 2024-02-24 DIAGNOSIS — M25611 Stiffness of right shoulder, not elsewhere classified: Secondary | ICD-10-CM | POA: Diagnosis not present

## 2024-02-28 DIAGNOSIS — R350 Frequency of micturition: Secondary | ICD-10-CM | POA: Diagnosis not present

## 2024-03-10 NOTE — Progress Notes (Unsigned)
 Cardiology Office Note:    Date:  03/17/2024   ID:  Crystal Potter, DOB 12/16/1950, MRN [id number]  PCP:  Sun, Vyvyan, MD   [facility] Providers Cardiologist:  None     Referring MD: Sun, Vyvyan, MD   Chief Complaint  Patient presents with   Shortness of Breath    History of Present Illness:    Crystal Potter is a 73 y.o. female seen at the request of Dr Paulene Boron for evaluation of SOB. She has a history of HTN, prediabetes, and OSA on CPAP. She states she has had SOB on exertion for years. More recently she underwent shoulder repair. She states that when she got the nerve block she suddenly became very SOB. States this lasted 3-4 days. This is not mentioned in her hospital records. Oxygen sats was 94% but did drop to 89% with walking. She does report some chronic swelling in her legs. No chest pain or palpitations. Had lost some weight on Ozempic but states it was too expensive.   Past Medical History:  Diagnosis Date   Anxiety    h/o panic attack- prior surgery for achilles    Arthritis    knees, back, hand -R    Bell's palsy 10/23/2003   DI (detrusor instability)    urgency- fr time to time    Fibroid    GERD (gastroesophageal reflux disease)    H/O hiatal hernia    History of kidney stones    Hypertension    followed by PCP, Dr. Paulene Boron   Neuromuscular disorder [facility])    Bells Palsy - R side of face - 2005   Pre-diabetes    Sleep apnea    CPAP    Past Surgical History:  Procedure Laterality Date   ABDOMINAL HYSTERECTOMY  10/22/1993   TAH AND BLADDER NECK SUSPENSION   ACHILLES TENDON SURGERY  10/22/2010   L side    ACHILLES TENDON SURGERY Right    APPENDECTOMY     /w open cholecystectomy    BREAST REDUCTION SURGERY     REDUCTION MAMMAPLASTY   CARPAL TUNNEL RELEASE Bilateral 10/22/1986   CHOLECYSTECTOMY  10/22/1973   COLONOSCOPY WITH PROPOFOL  N/A 07/17/2022   Procedure: COLONOSCOPY WITH PROPOFOL ;  Surgeon: Annis Kinder, DO;  Location: [facility];   Service: Gastroenterology;  Laterality: N/A;   EYE LID SURGERY  10/22/2004   R eye, weighted for treatment fr. bell's palsy    LASIK     2004   REVERSE SHOULDER ARTHROPLASTY Right 11/13/2023   Procedure: REVERSE SHOULDER ARTHROPLASTY;  Surgeon: Micheline Ahr, MD;  Location: [facility] ORS;  Service: Orthopedics;  Laterality: Right;   TOTAL KNEE ARTHROPLASTY  02/27/2012   Procedure: TOTAL KNEE ARTHROPLASTY; left Surgeon: Ferd Householder, MD;  Location: [facility] OR;  Service: Orthopedics;  Laterality: Left;   TOTAL KNEE ARTHROPLASTY Right 11/04/2013   Procedure: RIGHT TOTAL KNEE ARTHROPLASTY;  Surgeon: Ferd Householder, MD;  Location: [facility] OR;  Service: Orthopedics;  Laterality: Right;   TUBAL LIGATION      Current Medications: Current Meds  Medication Sig   albuterol  (VENTOLIN  HFA) 108 (90 Base) MCG/ACT inhaler Inhale 1-2 puffs into the lungs every 6 (six) hours as needed for wheezing or shortness of breath.   Ascorbic Acid (VITAMIN C) 1000 MG tablet Take 1,000 mg by mouth in the morning.   Calcium Carbonate-Vitamin D (CALCIUM + D PO) Take 2 tablets by mouth in the morning.   DULoxetine  (CYMBALTA ) 60 MG capsule Take 1  capsule (60 mg total) by mouth every morning.   esomeprazole (NEXIUM) 20 MG capsule Take 20 mg by mouth daily before breakfast.   lisinopril -hydrochlorothiazide  (PRINZIDE ,ZESTORETIC ) 20-12.5 MG per tablet Take 2 tablets by mouth in the morning.   loratadine  (CLARITIN ) 10 MG tablet Take 10 mg by mouth in the morning.   metFORMIN (GLUCOPHAGE) 500 MG tablet Take 1,000 mg by mouth every evening.   Omega-3 Fatty Acids (FISH OIL PO) Take 1,200 mg by mouth in the morning.   vitamin E 180 MG (400 UNITS) capsule Take 400 Units by mouth in the morning.     Allergies:   Patient has no known allergies.   Social History   Socioeconomic History   Marital status: Married    Spouse name: Not on file   Number of children: 1   Years of education: Not on file   Highest education level: Not on file   Occupational History   Occupation: retired  Tobacco Use   Smoking status: Never   Smokeless tobacco: Never   Tobacco comments:    occ alcohol  Vaping Use   Vaping status: Never Used  Substance and Sexual Activity   Alcohol use: Not Currently   Drug use: No   Sexual activity: Yes    Birth control/protection: Surgical  Other Topics Concern   Not on file  Social History Narrative   Not on file   Social Drivers of Health   Financial Resource Strain: Not on file  Food Insecurity: No Food Insecurity (11/13/2023)   Hunger Vital Sign    Worried About Running Out of Food in the Last Year: Never true    Ran Out of Food in the Last Year: Never true  Transportation Needs: No Transportation Needs (11/13/2023)   PRAPARE - Administrator, Civil Service (Medical): No    Lack of Transportation (Non-Medical): No  Physical Activity: Not on file  Stress: Not on file  Social Connections: Moderately Integrated (11/13/2023)   Social Connection and Isolation Panel [NHANES]    Frequency of Communication with Friends and Family: More than three times a week    Frequency of Social Gatherings with Friends and Family: More than three times a week    Attends Religious Services: More than 4 times per year    Active Member of [employer] or Organizations: Yes    Attends Banker Meetings: More than 4 times per year    Marital Status: Widowed     Family History: The patient's family history includes Bone cancer in her mother; Breast cancer in her maternal aunt and mother; Colon cancer in her maternal uncle; Diabetes in her maternal grandmother, maternal uncle, and paternal aunt; Heart attack in her maternal grandmother; Hyperlipidemia in her sister; Hypertension in her mother; Other in her sister; Stroke in her sister. There is no history of Anesthesia problems, Colon polyps, Esophageal cancer, Stomach cancer, or Rectal cancer.  ROS:   Please see the history of present illness.     All  other systems reviewed and are negative.  EKGs/Labs/Other Studies Reviewed:    The following studies were reviewed today: EKG Interpretation Date/Time:  Tuesday Mar 17 2024 10:53:33 EDT Ventricular Rate:  82 PR Interval:  136 QRS Duration:  76 QT Interval:  380 QTC Calculation: 443 R Axis:   18  Text Interpretation: Normal sinus rhythm Possible Left atrial enlargement When compared with ECG of 01-Nov-2023 10:54, No significant change was found Confirmed by [place], Teola Felipe [id number]) on 03/17/2024 10:56:43  AM   EKG Interpretation Date/Time:  Tuesday Mar 17 2024 10:53:33 EDT Ventricular Rate:  82 PR Interval:  136 QRS Duration:  76 QT Interval:  380 QTC Calculation: 443 R Axis:   18  Text Interpretation: Normal sinus rhythm Possible Left atrial enlargement When compared with ECG of 01-Nov-2023 10:54, No significant change was found Confirmed by [place], Marvens Hollars [phone]) on 03/17/2024 10:56:43 AM    Recent Labs: 11/01/2023: BUN 16; Creatinine, Ser 0.60; Hemoglobin 14.2; Platelets 459; Potassium 4.2; Sodium 133  Recent Lipid Panel    Component Value Date/Time   CHOL 169 08/20/2006 1018   TRIG 176 (H) 08/20/2006 1018   HDL 42.5 08/20/2006 1018   CHOLHDL 4.0 CALC 08/20/2006 1018   VLDL 35 08/20/2006 1018   LDLCALC 91 08/20/2006 1018     Risk Assessment/Calculations:                Physical Exam:    VS:  BP 123/85   Pulse 100   Ht 5\' 6"  (1.676 m)   Wt (!) 333 lb (151 kg)   SpO2 90%   BMI 53.75 kg/m     Wt Readings from Last 3 Encounters:  03/17/24 (!) 333 lb (151 kg)  11/13/23 (!) 340 lb (154.2 kg)  11/01/23 (!) 340 lb (154.2 kg)     GEN:  Well nourished, morbidly obese in no acute distress HEENT: Normal NECK: No JVD; No carotid bruits LYMPHATICS: No lymphadenopathy CARDIAC: RRR, no murmurs, rubs, gallops RESPIRATORY:  Clear to auscultation without rales, wheezing or rhonchi  ABDOMEN: Soft, non-tender, non-distended MUSCULOSKELETAL:  No edema; No deformity  SKIN:  Warm and dry NEUROLOGIC:  Alert and oriented x 3 PSYCHIATRIC:  Normal affect   ASSESSMENT:    1. SOB (shortness of breath)   2. Morbid obesity (HCC)   3. OSA (obstructive sleep apnea)    PLAN:    In order of problems listed above:  SOB. Contributing factors include morbid obesity and OSA. Suspect she has some diastolic dysfunction. Will order Echo to assess. Could be atypical presentation for CAD. If Echo unremarkable consider ischemic evaluation Morbid obesity with OSA HTN controlled.  Prediabetes.            Medication Adjustments/Labs and Tests Ordered: Current medicines are reviewed at length with the patient today.  Concerns regarding medicines are outlined above.  Orders Placed This Encounter  Procedures   EKG 12-Lead   ECHOCARDIOGRAM COMPLETE   No orders of the defined types were placed in this encounter.   Patient Instructions  Medication Instructions:  Continue same medications  Lab Work: None ordered  Testing/Procedures: Echo   first available   Follow-Up: At [facility], you and your health needs are our priority.  As part of our continuing mission to provide you with exceptional heart care, our providers are all part of one team.  This team includes your primary Cardiologist (physician) and Advanced Practice Providers or APPs (Physician Assistants and Nurse Practitioners) who all work together to provide you with the care you need, when you need it.  Your next appointment:  To Be Determined after test    Provider: Dr.Ally Knodel    We recommend signing up for the patient portal called "MyChart".  Sign up information is provided on this After Visit Summary.  MyChart is used to connect with patients for Virtual Visits (Telemedicine).  Patients are able to view lab/test results, encounter notes, upcoming appointments, etc.  Non-urgent messages can be sent to your provider as  well.   To learn more about what you can do with MyChart, go to  [email or web address].          Signed, Talyia Allende [place], MD  03/17/2024 11:32 AM    [facility]

## 2024-03-17 ENCOUNTER — Ambulatory Visit: Attending: Cardiology | Admitting: Cardiology

## 2024-03-17 ENCOUNTER — Encounter: Payer: Self-pay | Admitting: Cardiology

## 2024-03-17 VITALS — BP 123/85 | HR 100 | Ht 66.0 in | Wt 333.0 lb

## 2024-03-17 DIAGNOSIS — G4733 Obstructive sleep apnea (adult) (pediatric): Secondary | ICD-10-CM

## 2024-03-17 DIAGNOSIS — R0602 Shortness of breath: Secondary | ICD-10-CM

## 2024-03-17 NOTE — Patient Instructions (Signed)
 Medication Instructions:  Continue same medications  Lab Work: None ordered  Testing/Procedures: Echo  first available  Follow-Up: At [facility], you and your health needs are our priority.  As part of our continuing mission to provide you with exceptional heart care, our providers are all part of one team.  This team includes your primary Cardiologist (physician) and Advanced Practice Providers or APPs (Physician Assistants and Nurse Practitioners) who all work together to provide you with the care you need, when you need it.  Your next appointment:  To Be Determined after test    Provider:  Dr.Jordan    We recommend signing up for the patient portal called "MyChart".  Sign up information is provided on this After Visit Summary.  MyChart is used to connect with patients for Virtual Visits (Telemedicine).  Patients are able to view lab/test results, encounter notes, upcoming appointments, etc.  Non-urgent messages can be sent to your provider as well.   To learn more about what you can do with MyChart, go to [email or web address].

## 2024-03-24 DIAGNOSIS — I10 Essential (primary) hypertension: Secondary | ICD-10-CM | POA: Diagnosis not present

## 2024-04-10 DIAGNOSIS — R7303 Prediabetes: Secondary | ICD-10-CM | POA: Diagnosis not present

## 2024-04-10 DIAGNOSIS — F411 Generalized anxiety disorder: Secondary | ICD-10-CM | POA: Diagnosis not present

## 2024-04-10 DIAGNOSIS — Z6841 Body Mass Index (BMI) 40.0 and over, adult: Secondary | ICD-10-CM | POA: Diagnosis not present

## 2024-04-10 DIAGNOSIS — I10 Essential (primary) hypertension: Secondary | ICD-10-CM | POA: Diagnosis not present

## 2024-04-20 DIAGNOSIS — F411 Generalized anxiety disorder: Secondary | ICD-10-CM | POA: Diagnosis not present

## 2024-04-20 DIAGNOSIS — I10 Essential (primary) hypertension: Secondary | ICD-10-CM | POA: Diagnosis not present

## 2024-04-20 DIAGNOSIS — M19011 Primary osteoarthritis, right shoulder: Secondary | ICD-10-CM | POA: Diagnosis not present

## 2024-04-22 DIAGNOSIS — I10 Essential (primary) hypertension: Secondary | ICD-10-CM | POA: Diagnosis not present

## 2024-04-23 DIAGNOSIS — R350 Frequency of micturition: Secondary | ICD-10-CM | POA: Diagnosis not present

## 2024-04-23 DIAGNOSIS — N3946 Mixed incontinence: Secondary | ICD-10-CM | POA: Diagnosis not present

## 2024-04-27 DIAGNOSIS — G4733 Obstructive sleep apnea (adult) (pediatric): Secondary | ICD-10-CM | POA: Diagnosis not present

## 2024-04-28 DIAGNOSIS — I10 Essential (primary) hypertension: Secondary | ICD-10-CM | POA: Diagnosis not present

## 2024-04-28 DIAGNOSIS — J309 Allergic rhinitis, unspecified: Secondary | ICD-10-CM | POA: Diagnosis not present

## 2024-04-28 DIAGNOSIS — R519 Headache, unspecified: Secondary | ICD-10-CM | POA: Diagnosis not present

## 2024-04-28 DIAGNOSIS — G510 Bell's palsy: Secondary | ICD-10-CM | POA: Diagnosis not present

## 2024-04-28 NOTE — ED Provider Notes (Signed)
 SUPERVISING PHYSICIAN NOTE: I have personally evaluated this patient and discussed the patient's evaluation and management as documented by the Advanced Practice Provider. I personally made/approved the management plan and take responsibility for the patient management.   My findings confirm, and/or supplement their note as follows:      HPI Summary: History of Bell's palsy 7 years ago with residual deficit.  Bell's palsy affected her right side.  Today she noticed that her right facial weakness worsened she was having a hard time drinking out of a straw when she normally can.  She has a mild left-sided headache.  No severe headache.  No head trauma.  No fever.  No weakness or numbness in the arms or legs.  No difficulties walking.  She states her speech is slightly compromised by the droopy face    Exam Summary: She is awake and alert seen in the hallway as a code stroke due to current ER conditions She has a right-sided facial weakness including the forehead.  She is able to completely close the right eye.  Her pupils are equal.  Her right eye is slightly injected versus the left. Her muscle strength is 5/5 upper and lower extremities with no gross sensory deficits.  She is having no speech deficit    Clinical Impression/Medical Decision Making: Patient appears to have a recurrent Bell's palsy.  Does not appear to have a stroke today.  She had full workup at time of last Bell's palsy.  This included neurology, ENT and she took steroids and antivirals.                     MDM Elements

## 2024-04-29 ENCOUNTER — Ambulatory Visit (HOSPITAL_COMMUNITY)
Admission: RE | Admit: 2024-04-29 | Discharge: 2024-04-29 | Disposition: A | Discharge status: Home / Self Care | Source: Ambulatory Visit | Attending: Cardiovascular Disease | Admitting: Cardiovascular Disease

## 2024-04-29 ENCOUNTER — Ambulatory Visit: Payer: Self-pay | Admitting: Cardiology

## 2024-04-29 DIAGNOSIS — R0602 Shortness of breath: Secondary | ICD-10-CM | POA: Diagnosis not present

## 2024-04-29 DIAGNOSIS — G4733 Obstructive sleep apnea (adult) (pediatric): Secondary | ICD-10-CM | POA: Insufficient documentation

## 2024-04-29 LAB — ECHOCARDIOGRAM COMPLETE
Area-P 1/2: 3.42 cm2
S' Lateral: 3.1 cm

## 2024-05-04 ENCOUNTER — Other Ambulatory Visit: Payer: Self-pay

## 2024-05-04 ENCOUNTER — Telehealth: Payer: Self-pay

## 2024-05-04 DIAGNOSIS — R0602 Shortness of breath: Secondary | ICD-10-CM

## 2024-05-04 DIAGNOSIS — Z01812 Encounter for preprocedural laboratory examination: Secondary | ICD-10-CM

## 2024-05-04 MED ORDER — METOPROLOL TARTRATE 100 MG PO TABS: ORAL_TABLET | ORAL | 0 refills | Status: DC

## 2024-05-04 NOTE — Telephone Encounter (Signed)
 Your cardiac CT will be scheduled at one of the below locations:   [facility] [address] [place], [place] [address] [phone]  OR  [facility] [address] [place], [place] [address] [phone]  OR   [facility] [address] [place], [place] [address] [phone]  OR   [facility] [address] [place], [place] [address] [phone]  OR   [facility] [address]  [place], [place] [address]  If scheduled at [facility], please arrive at the [facility] and Children's Entrance (Entrance C2) of [facility] 30 minutes prior to test start time. You can use the FREE valet parking offered at entrance C (encouraged to control the heart rate for the test)  Proceed to the [facility] Radiology Department (first floor) to check-in and test prep.   All radiology patients and guests should use entrance C2 at [facility], accessed from [facility], even though the hospital's physical address listed is [address]    If scheduled at the Heart and Vascular Tower at [address], please enter the parking lot using the [address] entrance and use the FREE valet service at the patient drop-off area. Enter the buidling and check-in with registration on the main floor.  If scheduled at [facility] or [facility], please arrive 15 mins early for check-in and test prep.  There is spacious parking and easy access to the radiology department from the [facility] entrance. Please enter here and check-in with the desk attendant.   If scheduled at [facility], please arrive 30 minutes early for check-in and test prep.  Please follow these instructions carefully (unless otherwise directed):  An IV will be required for this test  and Nitroglycerin will be given.  Hold all erectile dysfunction medications at least 3 days (72 hrs) prior to test. (Ie viagra, cialis, sildenafil, tadalafil, etc)   On the Night Before the Test: Be sure to Drink plenty of water . Do not consume any caffeinated/decaffeinated beverages or chocolate 12 hours prior to your test. Do not take any antihistamines 12 hours prior to your test.   On the Day of the Test: Drink plenty of water  until 1 hour prior to the test. Do not eat any food 1 hour prior to test. You may take your regular medications prior to the test.  Take metoprolol  100 mg two hours prior to test. If you take Lisinopril /Hydrochlorothiazide  please HOLD on the morning of the test. FEMALES- please wear underwire-free bra if available, avoid dresses & tight clothing        After the Test: Drink plenty of water . After receiving IV contrast, you may experience a mild flushed feeling. This is normal. On occasion, you may experience a mild rash up to 24 hours after the test. This is not dangerous. If this occurs, you can take Benadryl  25 mg, Zyrtec, Claritin , or Allegra and increase your fluid intake. (Patients taking Tikosyn should avoid Benadryl , and may take Zyrtec, Claritin , or Allegra) If you experience trouble breathing, this can be serious. If it is severe call 911 IMMEDIATELY. If it is mild, please call our office.  We will call to schedule your test 2-4 weeks out understanding that some insurance companies will need an authorization prior to the service being performed.   For more information and frequently  asked questions, please visit our website : [email or web address]  For non-scheduling related questions, please contact the cardiac imaging nurse navigator should you have any questions/concerns: Cardiac Imaging Nurse Navigators Direct Office Dial: [phone]   For scheduling needs, including cancellations and rescheduling, please call [place],  [phone].

## 2024-05-06 ENCOUNTER — Encounter (HOSPITAL_COMMUNITY): Payer: Self-pay

## 2024-05-07 DIAGNOSIS — R4781 Slurred speech: Secondary | ICD-10-CM | POA: Diagnosis not present

## 2024-05-07 DIAGNOSIS — G510 Bell's palsy: Secondary | ICD-10-CM | POA: Diagnosis not present

## 2024-05-07 DIAGNOSIS — Z01812 Encounter for preprocedural laboratory examination: Secondary | ICD-10-CM | POA: Diagnosis not present

## 2024-05-07 DIAGNOSIS — J3489 Other specified disorders of nose and nasal sinuses: Secondary | ICD-10-CM | POA: Diagnosis not present

## 2024-05-08 ENCOUNTER — Ambulatory Visit: Payer: Self-pay | Admitting: Cardiology

## 2024-05-08 ENCOUNTER — Ambulatory Visit (HOSPITAL_COMMUNITY)
Admission: RE | Admit: 2024-05-08 | Discharge: 2024-05-08 | Disposition: A | Discharge status: Home / Self Care | Source: Ambulatory Visit | Attending: Cardiology | Admitting: Cardiology

## 2024-05-08 DIAGNOSIS — I517 Cardiomegaly: Secondary | ICD-10-CM | POA: Diagnosis not present

## 2024-05-08 DIAGNOSIS — R0602 Shortness of breath: Secondary | ICD-10-CM | POA: Insufficient documentation

## 2024-05-08 LAB — BASIC METABOLIC PANEL WITH GFR
BUN/Creatinine Ratio: 20 (ref 12–28)
BUN: 17 mg/dL (ref 8–27)
CO2: 22 mmol/L (ref 20–29)
Calcium: 9.2 mg/dL (ref 8.7–10.3)
Chloride: 97 mmol/L (ref 96–106)
Creatinine, Ser: 0.87 mg/dL (ref 0.57–1.00)
Glucose: 123 mg/dL — ABNORMAL HIGH (ref 70–99)
Potassium: 4.2 mmol/L (ref 3.5–5.2)
Sodium: 138 mmol/L (ref 134–144)
eGFR: 70 mL/min/1.73 (ref >59)

## 2024-05-08 MED ORDER — IOHEXOL 350 MG/ML SOLN
100.0000 mL | Freq: Once | INTRAVENOUS | Status: AC | PRN
  Administered 2024-05-08: 125 mL via INTRAVENOUS

## 2024-05-08 MED ORDER — NITROGLYCERIN 0.4 MG SL SUBL
0.8000 mg | SUBLINGUAL_TABLET | Freq: Once | SUBLINGUAL | Status: AC
  Administered 2024-05-08: 0.8 mg via SUBLINGUAL

## 2024-05-11 ENCOUNTER — Ambulatory Visit: Payer: Self-pay | Admitting: Cardiology

## 2024-05-21 DIAGNOSIS — M19011 Primary osteoarthritis, right shoulder: Secondary | ICD-10-CM | POA: Diagnosis not present

## 2024-05-21 DIAGNOSIS — I10 Essential (primary) hypertension: Secondary | ICD-10-CM | POA: Diagnosis not present

## 2024-05-21 DIAGNOSIS — F411 Generalized anxiety disorder: Secondary | ICD-10-CM | POA: Diagnosis not present

## 2024-05-22 DIAGNOSIS — I10 Essential (primary) hypertension: Secondary | ICD-10-CM | POA: Diagnosis not present

## 2024-05-24 DIAGNOSIS — R4781 Slurred speech: Secondary | ICD-10-CM | POA: Diagnosis not present

## 2024-06-08 DIAGNOSIS — G510 Bell's palsy: Secondary | ICD-10-CM | POA: Diagnosis not present

## 2024-06-08 DIAGNOSIS — R4781 Slurred speech: Secondary | ICD-10-CM | POA: Diagnosis not present

## 2024-06-21 DIAGNOSIS — F411 Generalized anxiety disorder: Secondary | ICD-10-CM | POA: Diagnosis not present

## 2024-06-21 DIAGNOSIS — M19011 Primary osteoarthritis, right shoulder: Secondary | ICD-10-CM | POA: Diagnosis not present

## 2024-06-21 DIAGNOSIS — I10 Essential (primary) hypertension: Secondary | ICD-10-CM | POA: Diagnosis not present

## 2024-07-21 DIAGNOSIS — F411 Generalized anxiety disorder: Secondary | ICD-10-CM | POA: Diagnosis not present

## 2024-07-21 DIAGNOSIS — M19011 Primary osteoarthritis, right shoulder: Secondary | ICD-10-CM | POA: Diagnosis not present

## 2024-07-21 DIAGNOSIS — I10 Essential (primary) hypertension: Secondary | ICD-10-CM | POA: Diagnosis not present

## 2024-07-27 DIAGNOSIS — L821 Other seborrheic keratosis: Secondary | ICD-10-CM | POA: Diagnosis not present

## 2024-07-27 DIAGNOSIS — L404 Guttate psoriasis: Secondary | ICD-10-CM | POA: Diagnosis not present

## 2024-07-27 DIAGNOSIS — L812 Freckles: Secondary | ICD-10-CM | POA: Diagnosis not present

## 2024-07-27 DIAGNOSIS — L820 Inflamed seborrheic keratosis: Secondary | ICD-10-CM | POA: Diagnosis not present

## 2024-07-27 DIAGNOSIS — Z85828 Personal history of other malignant neoplasm of skin: Secondary | ICD-10-CM | POA: Diagnosis not present

## 2024-07-28 DIAGNOSIS — G4733 Obstructive sleep apnea (adult) (pediatric): Secondary | ICD-10-CM | POA: Diagnosis not present

## 2024-08-04 DIAGNOSIS — N3281 Overactive bladder: Secondary | ICD-10-CM | POA: Diagnosis not present

## 2024-08-04 DIAGNOSIS — R399 Unspecified symptoms and signs involving the genitourinary system: Secondary | ICD-10-CM | POA: Diagnosis not present

## 2024-08-04 DIAGNOSIS — R350 Frequency of micturition: Secondary | ICD-10-CM | POA: Diagnosis not present

## 2024-08-04 DIAGNOSIS — R351 Nocturia: Secondary | ICD-10-CM | POA: Diagnosis not present

## 2024-08-20 DIAGNOSIS — I10 Essential (primary) hypertension: Secondary | ICD-10-CM | POA: Diagnosis not present

## 2024-08-21 DIAGNOSIS — I10 Essential (primary) hypertension: Secondary | ICD-10-CM | POA: Diagnosis not present

## 2024-08-21 DIAGNOSIS — M19011 Primary osteoarthritis, right shoulder: Secondary | ICD-10-CM | POA: Diagnosis not present

## 2024-08-21 DIAGNOSIS — F411 Generalized anxiety disorder: Secondary | ICD-10-CM | POA: Diagnosis not present

## 2024-08-24 DIAGNOSIS — N390 Urinary tract infection, site not specified: Secondary | ICD-10-CM | POA: Diagnosis not present

## 2024-08-24 DIAGNOSIS — I10 Essential (primary) hypertension: Secondary | ICD-10-CM | POA: Diagnosis not present

## 2024-08-24 DIAGNOSIS — R519 Headache, unspecified: Secondary | ICD-10-CM | POA: Diagnosis not present

## 2024-09-19 DIAGNOSIS — I10 Essential (primary) hypertension: Secondary | ICD-10-CM | POA: Diagnosis not present

## 2024-09-20 DIAGNOSIS — M19011 Primary osteoarthritis, right shoulder: Secondary | ICD-10-CM | POA: Diagnosis not present

## 2024-09-20 DIAGNOSIS — F411 Generalized anxiety disorder: Secondary | ICD-10-CM | POA: Diagnosis not present

## 2024-09-20 DIAGNOSIS — I10 Essential (primary) hypertension: Secondary | ICD-10-CM | POA: Diagnosis not present

## 2024-10-01 DIAGNOSIS — G4733 Obstructive sleep apnea (adult) (pediatric): Secondary | ICD-10-CM | POA: Diagnosis not present

## 2024-11-18 ENCOUNTER — Ambulatory Visit: Admitting: Gastroenterology

## 2024-11-18 ENCOUNTER — Encounter: Payer: Self-pay | Admitting: Gastroenterology

## 2024-11-18 ENCOUNTER — Other Ambulatory Visit (INDEPENDENT_AMBULATORY_CARE_PROVIDER_SITE_OTHER)

## 2024-11-18 VITALS — BP 124/78 | HR 97 | Ht 66.0 in | Wt 325.0 lb

## 2024-11-18 DIAGNOSIS — R1310 Dysphagia, unspecified: Secondary | ICD-10-CM

## 2024-11-18 DIAGNOSIS — Z8669 Personal history of other diseases of the nervous system and sense organs: Secondary | ICD-10-CM

## 2024-11-18 DIAGNOSIS — K219 Gastro-esophageal reflux disease without esophagitis: Secondary | ICD-10-CM

## 2024-11-18 DIAGNOSIS — G510 Bell's palsy: Secondary | ICD-10-CM

## 2024-11-18 NOTE — Progress Notes (Signed)
 "  Chief Complaint:GERD, dysphagia Primary GI Doctor:Dr. San  HPI:  Crystal Potter is a  74  year old female Crystal Potter with past medical history of GERD, Bell's Palsy, and prediabetes, who was referred to me by Sun, Vyvyan, MD on 11/03/24 for a evaluation of GERD,dysphagia .    Interval History Crystal Potter last seen in GI office on 05/17/22 by Delon, PA t discuss colonoscopy.  Crystal Potter presents for evaluation for dysphagia with solids. She reports it feels like a piece of food gets caught in her throat and it comes right back up or she will drink something to make it go down.  She was diagnosed with Bell's palsy about 20 years ago, not sure if related to her current issues.  Crystal Potter has history of GERD and taking Esomeprazole 20 mg po daily. She takes occasional OTC Tums for breakthrough symptoms at night. Never had EGD.  Crystal Potter denies nausea, vomiting, or weight loss. Appetite good.   She reports normal BM's, occasional intermittent BRB with wiping form internal hemorrhoids.   Nonsmoker. Very seldom drinks alcohol.   Crystal Potter on baby ASA 81 mg po daily   Crystal Potter's family history includes: no colon CA, no esophageal CA, uncle with CA, unknown type?  Wt Readings from Last 3 Encounters:  11/18/24 (!) 325 lb (147.4 kg)  03/17/24 (!) 333 lb (151 kg)  11/13/23 (!) 340 lb (154.2 kg)     Past Medical History:  Diagnosis Date   Anxiety    h/o panic attack- prior surgery for achilles    Arthritis    knees, back, hand -R    Bell's palsy 10/23/2003   DI (detrusor instability)    urgency- fr time to time    Fibroid    GERD (gastroesophageal reflux disease)    H/O hiatal hernia    History of kidney stones    Hypertension    followed by PCP, Dr. Austin   Neuromuscular disorder [facility]    Bells Palsy - R side of face - 2005   Pre-diabetes    Sleep apnea    CPAP    Past Surgical History:  Procedure Laterality Date   ABDOMINAL HYSTERECTOMY  10/22/1993   TAH AND BLADDER NECK SUSPENSION    ACHILLES TENDON SURGERY  10/22/2010   L side    ACHILLES TENDON SURGERY Right    APPENDECTOMY     /w open cholecystectomy    BREAST REDUCTION SURGERY     REDUCTION MAMMAPLASTY   CARPAL TUNNEL RELEASE Bilateral 10/22/1986   CHOLECYSTECTOMY  10/22/1973   COLONOSCOPY WITH PROPOFOL  N/A 07/17/2022   Procedure: COLONOSCOPY WITH PROPOFOL ;  Surgeon: San Sandor GAILS, DO;  Location: [facility];  Service: Gastroenterology;  Laterality: N/A;   EYE LID SURGERY  10/22/2004   R eye, weighted for treatment fr. bell's palsy    LASIK     2004   REVERSE SHOULDER ARTHROPLASTY Right 11/13/2023   Procedure: REVERSE SHOULDER ARTHROPLASTY;  Surgeon: Cristy Bonner DASEN, MD;  Location: [facility];  Service: Orthopedics;  Laterality: Right;   TOTAL KNEE ARTHROPLASTY  02/27/2012   Procedure: TOTAL KNEE ARTHROPLASTY; left Surgeon: Toribio JULIANNA Chancy, MD;  Location: [facility] OR;  Service: Orthopedics;  Laterality: Left;   TOTAL KNEE ARTHROPLASTY Right 11/04/2013   Procedure: RIGHT TOTAL KNEE ARTHROPLASTY;  Surgeon: Toribio JULIANNA Chancy, MD;  Location: [facility] OR;  Service: Orthopedics;  Laterality: Right;   TUBAL LIGATION      Current Outpatient Medications  Medication Sig Dispense Refill   albuterol  (VENTOLIN  HFA) 108 (90  Base) MCG/ACT inhaler Inhale 1-2 puffs into the lungs every 6 (six) hours as needed for wheezing or shortness of breath.     Ascorbic Acid (VITAMIN C) 1000 MG tablet Take 1,000 mg by mouth in the morning.     ASPIRIN  81 PO Take 1 tablet by mouth daily.     Calcium Carbonate-Vitamin D (CALCIUM + D PO) Take 2 tablets by mouth in the morning.     Docusate Calcium (STOOL SOFTENER PO) Take 1 each by mouth daily.     DULoxetine  (CYMBALTA ) 30 MG capsule Take 30 mg by mouth daily.     DULoxetine  (CYMBALTA ) 60 MG capsule Take 1 capsule (60 mg total) by mouth every morning. 90 capsule 3   esomeprazole (NEXIUM) 20 MG capsule Take 20 mg by mouth daily before breakfast.     lisinopril -hydrochlorothiazide  (PRINZIDE ,ZESTORETIC )  20-12.5 MG per tablet Take 2 tablets by mouth in the morning.     loratadine  (CLARITIN ) 10 MG tablet Take 10 mg by mouth in the morning.     metFORMIN (GLUCOPHAGE) 500 MG tablet Take 1,000 mg by mouth every evening.     Omega-3 Fatty Acids (FISH OIL PO) Take 1,200 mg by mouth in the morning.     tolterodine (DETROL LA) 4 MG 24 hr capsule Take 1 capsule by mouth daily.     vitamin E 180 MG (400 UNITS) capsule Take 400 Units by mouth in the morning.     No current facility-administered medications for this visit.    Allergies as of 11/18/2024   (No Known Allergies)    Family History  Problem Relation Age of Onset   Bone cancer Mother    Hypertension Mother    Breast cancer Mother        Age 59   Other Sister        prediabetes   Stroke Sister    Hyperlipidemia Sister    Breast cancer Maternal Aunt        Age 22's   Colon cancer Maternal Uncle    Diabetes Maternal Uncle    Diabetes Paternal Aunt    Diabetes Maternal Grandmother    Heart attack Maternal Grandmother    Anesthesia problems Neg Hx    Colon polyps Neg Hx    Esophageal cancer Neg Hx    Stomach cancer Neg Hx    Rectal cancer Neg Hx     Review of Systems:    Constitutional: No weight loss, fever, chills, weakness or fatigue HEENT: Eyes: No change in vision               Ears, Nose, Throat:  No change in hearing or congestion Skin: No rash or itching Cardiovascular: No chest pain, chest pressure or palpitations   Respiratory: No SOB or cough Gastrointestinal: See HPI and otherwise negative Genitourinary: No dysuria or change in urinary frequency Neurological: No headache, dizziness or syncope Musculoskeletal: No new muscle or joint pain Hematologic: No bleeding or bruising Psychiatric: No history of depression or anxiety    Physical Exam:  Vital signs: BP 124/78   Pulse 97   Ht 5' 6 (1.676 m)   Wt (!) 325 lb (147.4 kg)   BMI 52.46 kg/m   Constitutional:   Pleasant female appears to be in NAD, Well  developed, Well nourished, alert and cooperative Eyes:   PEERL, EOMI. No icterus. Conjunctiva pink. Neck:  Supple Throat: Oral cavity and pharynx without inflammation, swelling or lesion.  Respiratory: Respirations even and unlabored. Lungs clear  to auscultation bilaterally.   No wheezes, crackles, or rhonchi.  Cardiovascular: Normal S1, S2. Regular rate and rhythm. No peripheral edema, cyanosis or pallor.  Gastrointestinal:  Soft, nondistended, nontender. No rebound or guarding. Normal bowel sounds. No appreciable masses or hepatomegaly. Rectal:  Not performed.  Msk:  Symmetrical without gross deformities. Without edema, no deformity or joint abnormality.  Neurologic:  Alert and  oriented x4;  grossly normal neurologically.  Skin:   Dry and intact without significant lesions or rashes.  RELEVANT LABS AND IMAGING: CBC    Latest Ref Rng & Units 11/01/2023   10:40 AM 11/06/2013    3:45 AM 11/05/2013    2:46 AM  CBC  WBC 4.0 - 10.5 K/uL 7.8  18.7  16.9   Hemoglobin 12.0 - 15.0 g/dL 85.7  88.8  87.6   Hematocrit 36.0 - 46.0 % 41.4  34.5  37.6   Platelets 150 - 400 K/uL 459  275  297      CMP     Latest Ref Rng & Units 05/07/2024    9:55 AM 11/01/2023   10:40 AM 11/06/2013    3:45 AM  CMP  Glucose 70 - 99 mg/dL 876  886  873   BUN 8 - 27 mg/dL 17  16  15    Creatinine 0.57 - 1.00 mg/dL 9.12  9.39  9.11   Sodium 134 - 144 mmol/L 138  133  138   Potassium 3.5 - 5.2 mmol/L 4.2  4.2  5.3   Chloride 96 - 106 mmol/L 97  99  100   CO2 20 - 29 mmol/L 22  26  27    Calcium 8.7 - 10.3 mg/dL 9.2  9.2  8.7      Lab Results  Component Value Date   TSH 2.73 08/20/2006  02/2024 echo-  Left ventricular ejection fraction, by estimation, is 60 to 65%.    GI procedures: 06/2022 colonoscopy, recall 10 years - Perianal skin tags found on perianal exam.- Diverticulosis in the sigmoid colon, in the descending colon and in the ascending colon. - Non- bleeding internal hemorrhoids. - Small hypertrophied  anal papillae noted on retroflexion. - No specimens collected.  11/2011 colonoscopy Normal colon  Assessment/plan: Encounter Diagnoses  Name Primary?   Gastroesophageal reflux disease, unspecified whether esophagitis present Yes   Dysphagia, unspecified type    74 year old female Crystal Potter who presents with chronic eso dysphagia and history of GERD. Currently on esomeprazole 20mg  po daily. Never had EGD. She would like to avoid endoscopic procedures initially, but agrees to pursue barium esophagram to rule out stricture or web. Also important to note she has hx of Bell's palsy.  Order barium esophagram  Check TSH today (no recent on file)  Continue esomeprazole 20 mg po daily GERD diet, no late meals 3-4 hours prior to laying down Colon screening recall 06/2032 Hx of bells palsy     Thank you for the courtesy of this consult. Please call me with any questions or concerns.   Kia Stavros, FNP-C [facility] 11/18/2024, 3:16 PM  Cc: Sun, Vyvyan, MD  "

## 2024-11-18 NOTE — Patient Instructions (Addendum)
 GERD Continue esomeprazole 20 mg po daily GERD diet, no late meals 3-4 hours prior to laying down   Dysphagia Pamphlet attached   Your provider has requested that you go to the basement level for lab work before leaving today. Press B on the elevator. The lab is located at the first door on the left as you exit the elevator.   You have been scheduled for a Barium Esophogram at [facility] (1st floor of the hospital) on 12/21/24 at 9:30am. Please arrive 30 minutes prior to your appointment for registration. Make certain not to have anything to eat or drink 3 hours prior to your test. If you need to reschedule for any reason, please contact radiology at [phone] to do so. __________________________________________________________________ A barium swallow is an examination that concentrates on views of the esophagus. This tends to be a double contrast exam (barium and two liquids which, when combined, create a gas to distend the wall of the oesophagus) or single contrast (non-ionic iodine based). The study is usually tailored to your symptoms so a good history is essential. Attention is paid during the study to the form, structure and configuration of the esophagus, looking for functional disorders (such as aspiration, dysphagia, achalasia, motility and reflux) EXAMINATION You may be asked to change into a gown, depending on the type of swallow being performed. A radiologist and radiographer will perform the procedure. The radiologist will advise you of the type of contrast selected for your procedure and direct you during the exam. You will be asked to stand, sit or lie in several different positions and to hold a small amount of fluid in your mouth before being asked to swallow while the imaging is performed .In some instances you may be asked to swallow barium coated marshmallows to assess the motility of a solid food bolus. The exam can be recorded as a digital or video fluoroscopy  procedure. POST PROCEDURE It will take 1-2 days for the barium to pass through your system. To facilitate this, it is important, unless otherwise directed, to increase your fluids for the next 24-48hrs and to resume your normal diet.  This test typically takes about 30 minutes to perform. __________________________________________________________________________________

## 2024-11-19 ENCOUNTER — Ambulatory Visit: Payer: Self-pay | Admitting: Gastroenterology

## 2024-11-19 LAB — TSH: TSH: 1.92 u[IU]/mL (ref 0.35–5.50)

## 2024-12-21 ENCOUNTER — Ambulatory Visit (HOSPITAL_COMMUNITY)
